# Patient Record
Sex: Female | Born: 1975 | Marital: Single | Smoking: Former smoker
Health system: Southern US, Community
[De-identification: ages and names within clinical notes are randomized; demographics above are authoritative.]

## PROBLEM LIST (undated history)

## (undated) DIAGNOSIS — M5430 Sciatica, unspecified side: Secondary | ICD-10-CM

## (undated) DIAGNOSIS — L732 Hidradenitis suppurativa: Secondary | ICD-10-CM

## (undated) DIAGNOSIS — E669 Obesity, unspecified: Secondary | ICD-10-CM

---

## 2017-02-02 DIAGNOSIS — J45909 Unspecified asthma, uncomplicated: Secondary | ICD-10-CM | POA: Insufficient documentation

## 2017-09-18 ENCOUNTER — Ambulatory Visit: Payer: Managed Care, Other (non HMO) | Admitting: Sports Medicine

## 2017-10-02 ENCOUNTER — Ambulatory Visit (INDEPENDENT_AMBULATORY_CARE_PROVIDER_SITE_OTHER): Payer: Managed Care, Other (non HMO) | Admitting: Sports Medicine

## 2017-10-02 ENCOUNTER — Encounter: Payer: Self-pay | Admitting: Sports Medicine

## 2017-10-02 ENCOUNTER — Ambulatory Visit (INDEPENDENT_AMBULATORY_CARE_PROVIDER_SITE_OTHER): Payer: Managed Care, Other (non HMO)

## 2017-10-02 DIAGNOSIS — K602 Anal fissure, unspecified: Secondary | ICD-10-CM | POA: Insufficient documentation

## 2017-10-02 DIAGNOSIS — M2011 Hallux valgus (acquired), right foot: Secondary | ICD-10-CM | POA: Diagnosis not present

## 2017-10-02 DIAGNOSIS — M79671 Pain in right foot: Secondary | ICD-10-CM | POA: Diagnosis not present

## 2017-10-02 DIAGNOSIS — L732 Hidradenitis suppurativa: Secondary | ICD-10-CM | POA: Insufficient documentation

## 2017-10-02 DIAGNOSIS — B009 Herpesviral infection, unspecified: Secondary | ICD-10-CM | POA: Insufficient documentation

## 2017-10-02 NOTE — Progress Notes (Signed)
Subjective: Whitney RadMarisa Baker is a 42 y.o. female patient who presents to office for evaluation of Right> Left bunion pain. Patient complains of progressive pain especially over the last 2 months in the Right>Left foot that starts as pain over the bump with direct pressure and range of motion; patient now has difficulty fitting shoes comfortably. Ranks pain 5/10 and is now interferring with daily activities but now swelling over bunion appears to be going down.  Patient has also tried change in shoes; tennis shoes help. Patient denies any other pedal complaints.  Review of Systems  Musculoskeletal: Positive for joint pain.  All other systems reviewed and are negative.   +Family history of bunions   Patient Active Problem List   Diagnosis Date Noted  . Anal fissure 10/02/2017  . 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 Hidradenitis suppurativa 10/02/2017  . Asthma 02/02/2017    Current Outpatient Medications on File Prior to Visit  Medication Sig Dispense Refill  . clindamycin (CLEOCIN) 300 MG capsule clindamycin HCl 300 mg capsule  TAKE 1 CAPSULE BY MOUTH TWICE A DAY    . Norethindrone-Ethinyl Estradiol-Fe Biphas (LO LOESTRIN FE) 1 MG-10 MCG / 10 MCG tablet Lo Loestrin Fe 1 mg-10 mcg (24)/10 mcg (2) tablet    . rifampin (RIFADIN) 300 MG capsule Take 300 mg by mouth 2 (two) times daily.  2   No current facility-administered medications on file prior to visit.     No Known Allergies  Objective:  General: Alert and oriented x3 in no acute distress  Dermatology: No open lesions bilateral lower extremities, no webspace macerations, no ecchymosis bilateral, all nails x 10 are well manicured.  Vascular: Dorsalis Pedis and Posterior Tibial pedal pulses 2/4, Capillary Fill Time 3 seconds, (+) pedal hair growth bilateral, no edema bilateral lower extremities, Temperature gradient within normal limits.  Neurology: Gross sensation intact via light touch bilateral.  Musculoskeletal:  Mild tenderness with palpation right>left bunion deformity, no limitation or crepitus with range of motion, deformity reducible, tracking not trackbound, there is no 1st ray hypermobility noted bilateral. Midtarsal, Subtalar joint, and ankle joint range of motion is within normal limits. On weightbearing exam, there is decreased 1st MTPJ rom Right>Left with functional limitus noted, there is medial arch collapse Right> Left on weightbearing, rearfoot slight varus/valgus, forefoot slight abduction with HAV deformity supported on ground with no second toe crossover deformity noted.   Xrays  Right Foot    Impression: Intermetatarsal angle above normal limits supportive of bunion      Assessment and Plan: Problem List Items Addressed This Visit    None    Visit Diagnoses    Hallux valgus of right foot    -  Primary   Relevant Orders   DG Foot Complete Right (Completed)   Right foot pain           -Complete examination performed -Xrays reviewed -Discussed treatement options; discussed HAV deformity;conservative and  Surgical management; risks, benefits, alternatives discussed. All patient's questions answered. -Patient declines surgery and wants conservative care -Recommend continue with good supportive shoes and inserts. Note given for work. -Patient to return to office as needed or sooner if condition worsens.  Whitney Baker, DPM

## 2017-10-02 NOTE — Patient Instructions (Signed)

## 2019-07-07 DIAGNOSIS — Z01419 Encounter for gynecological examination (general) (routine) without abnormal findings: Secondary | ICD-10-CM | POA: Diagnosis not present

## 2019-07-07 DIAGNOSIS — Z124 Encounter for screening for malignant neoplasm of cervix: Secondary | ICD-10-CM | POA: Diagnosis not present

## 2019-07-07 DIAGNOSIS — Z1239 Encounter for other screening for malignant neoplasm of breast: Secondary | ICD-10-CM | POA: Diagnosis not present

## 2019-07-07 DIAGNOSIS — Z6841 Body Mass Index (BMI) 40.0 and over, adult: Secondary | ICD-10-CM | POA: Diagnosis not present

## 2019-07-07 DIAGNOSIS — Z309 Encounter for contraceptive management, unspecified: Secondary | ICD-10-CM | POA: Diagnosis not present

## 2019-07-07 DIAGNOSIS — R87810 Cervical high risk human papillomavirus (HPV) DNA test positive: Secondary | ICD-10-CM | POA: Diagnosis not present

## 2019-07-07 DIAGNOSIS — Z1231 Encounter for screening mammogram for malignant neoplasm of breast: Secondary | ICD-10-CM | POA: Diagnosis not present

## 2019-07-07 LAB — HM MAMMOGRAPHY

## 2019-07-07 LAB — RESULTS CONSOLE HPV: CHL HPV: POSITIVE

## 2019-07-07 LAB — HM PAP SMEAR: HM Pap smear: NORMAL

## 2019-07-15 DIAGNOSIS — B977 Papillomavirus as the cause of diseases classified elsewhere: Secondary | ICD-10-CM | POA: Insufficient documentation

## 2020-05-11 DIAGNOSIS — B373 Candidiasis of vulva and vagina: Secondary | ICD-10-CM | POA: Diagnosis not present

## 2020-05-11 DIAGNOSIS — N760 Acute vaginitis: Secondary | ICD-10-CM | POA: Diagnosis not present

## 2020-05-11 DIAGNOSIS — N898 Other specified noninflammatory disorders of vagina: Secondary | ICD-10-CM | POA: Diagnosis not present

## 2020-05-24 ENCOUNTER — Other Ambulatory Visit: Payer: Self-pay

## 2020-05-24 ENCOUNTER — Encounter (HOSPITAL_COMMUNITY): Payer: Self-pay

## 2020-05-24 ENCOUNTER — Ambulatory Visit (HOSPITAL_COMMUNITY)
Admission: EM | Admit: 2020-05-24 | Discharge: 2020-05-24 | Disposition: A | Discharge status: Home / Self Care | Payer: BC Managed Care – PPO | Attending: Family Medicine | Admitting: Family Medicine

## 2020-05-24 DIAGNOSIS — M79605 Pain in left leg: Secondary | ICD-10-CM

## 2020-05-24 DIAGNOSIS — M5432 Sciatica, left side: Secondary | ICD-10-CM | POA: Diagnosis not present

## 2020-05-24 MED ORDER — KETOROLAC TROMETHAMINE 60 MG/2ML IM SOLN
60.0000 mg | Freq: Once | INTRAMUSCULAR | Status: AC
  Administered 2020-05-24: 60 mg via INTRAMUSCULAR

## 2020-05-24 MED ORDER — KETOROLAC TROMETHAMINE 60 MG/2ML IM SOLN
INTRAMUSCULAR | Status: AC
  Filled 2020-05-24: qty 2

## 2020-05-24 MED ORDER — CYCLOBENZAPRINE HCL 10 MG PO TABS: 10.0000 mg | ORAL_TABLET | Freq: Three times a day (TID) | ORAL | 0 refills | Status: DC | PRN

## 2020-05-24 MED ORDER — PREDNISONE 20 MG PO TABS: 40.0000 mg | ORAL_TABLET | Freq: Every day | ORAL | 0 refills | Status: DC

## 2020-05-24 NOTE — ED Triage Notes (Signed)
Pt c/o left posterior thigh and buttocks pain intermittently for past several months, worse the past couple days. Pt states she took someone's muscle relaxants last night with some improvement to pain.   Pt took 800mg  ibuprofen and 1000mg  tylenol yesterday. Has been applying ice/heat.  Denies injury to area.

## 2020-05-24 NOTE — ED Provider Notes (Addendum)
[facility]    CSN: [id number] Arrival date & time: 05/24/20  2423      History   Chief Complaint Chief Complaint  Patient presents with  . Leg Pain    HPI Whitney Baker is a 45 y.o. female.   Patient presenting today with acute on chronic left buttock and posterior leg pain with radiation down toward foot, numbness, tingling that she states has been ongoing for the past few months but worse the past couple days to where there is no comfortable position that she can find.  She denies any injury at onset of symptoms.  No bowel or bladder incontinence, saddle paresthesias, fever, chills, gait instability, known chronic back issues.  Has been taking over-the-counter pain relievers with minimal relief and a friend gave her some baclofen which she has been taking the past few days with mild relief.  Heat also does help some temporarily.     History reviewed. No pertinent past medical history.  Patient Active Problem List   Diagnosis Date Noted  . Anal fissure 10/02/2017  . 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 Hidradenitis suppurativa 10/02/2017  . Asthma 02/02/2017    History reviewed. No pertinent surgical history.  OB History   No obstetric history on file.      Home Medications    Prior to Admission medications   Medication Sig Start Date End Date Taking? Authorizing Provider  cyclobenzaprine (FLEXERIL) 10 MG tablet Take 1 tablet (10 mg total) by mouth 3 (three) times daily as needed for muscle spasms. DO NOT DRINK ALCOHOL OR DRIVE WHILE TAKING THIS MEDICATION 05/24/20  Yes Particia Nearing, PA-C  Norethindrone-Ethinyl Estradiol-Fe Biphas (LO LOESTRIN FE) 1 MG-10 MCG / 10 MCG tablet Lo Loestrin Fe 1 mg-10 mcg (24)/10 mcg (2) tablet   Yes [provider]  predniSONE (DELTASONE) 20 MG tablet Take 2 tablets (40 mg total) by mouth daily with breakfast. 05/24/20  Yes Particia Nearing, PA-C  clindamycin (CLEOCIN) 300 MG capsule  clindamycin HCl 300 mg capsule  TAKE 1 CAPSULE BY MOUTH TWICE A DAY    [provider]  rifampin (RIFADIN) 300 MG capsule Take 300 mg by mouth 2 (two) times daily. 09/10/17   [provider]    Family History History reviewed. No pertinent family history.  Social History Social History   Tobacco Use  . Smoking status: Former Games developer  . Smokeless tobacco: Never Used  Substance Use Topics  . Alcohol use: Yes    Comment: occas  . Drug use: Never     Allergies   Patient has no known allergies.   Review of Systems Review of Systems Per HPI  Physical Exam Triage Vital Signs ED Triage Vitals  Enc Vitals Group     BP 05/24/20 0930 132/90     Pulse Rate 05/24/20 0930 (!) 112     Resp 05/24/20 0930 20     Temp 05/24/20 0930 98.3 F (36.8 C)     Temp Source 05/24/20 0930 Oral     SpO2 05/24/20 0930 98 %     Weight --      Height --      Head Circumference --      Peak Flow --      Pain Score 05/24/20 0928 10     Pain Loc --      Pain Edu? --      Excl. in GC? --    No data found.  Updated Vital  Signs BP 132/90 (BP Location: Right Arm)   Pulse (!) 112   Temp 98.3 F (36.8 C) (Oral)   Resp 20   LMP 01/27/2020 (Approximate) Comment: oral contraceptive  SpO2 98%   Visual Acuity Right Eye Distance:   Left Eye Distance:   Bilateral Distance:    Right Eye Near:   Left Eye Near:    Bilateral Near:     Physical Exam Vitals and nursing note reviewed.  Constitutional:      Appearance: Normal appearance. She is not ill-appearing.  HENT:     Head: Atraumatic.  Eyes:     Extraocular Movements: Extraocular movements intact.     Conjunctiva/sclera: Conjunctivae normal.  Cardiovascular:     Rate and Rhythm: Normal rate and regular rhythm.     Pulses: Normal pulses.     Heart sounds: Normal heart sounds.  Pulmonary:     Effort: Pulmonary effort is normal.     Breath sounds: Normal breath sounds.  Abdominal:     General: Bowel sounds are normal.  There is no distension.     Palpations: Abdomen is soft.     Tenderness: There is no abdominal tenderness. There is no guarding.  Musculoskeletal:        General: Tenderness present. Normal range of motion.     Cervical back: Normal range of motion and neck supple.     Comments: Posterior left buttock extending down left posterior upper leg tender to palpation Negative straight leg raise bilateral lower extremities, though testing limited by patient's significant pain with any range of motion  Skin:    General: Skin is warm and dry.  Neurological:     Mental Status: She is alert and oriented to person, place, and time.     Sensory: No sensory deficit.     Motor: No weakness.     Gait: Gait abnormal (antalgic gait).  Psychiatric:        Mood and Affect: Mood normal.        Thought Content: Thought content normal.        Judgment: Judgment normal.      UC Treatments / Results  Labs (all labs ordered are listed, but only abnormal results are displayed) Labs Reviewed - No data to display  EKG   Radiology No results found.  Procedures Procedures (including critical care time)  Medications Ordered in UC Medications  ketorolac (TORADOL) injection 60 mg (60 mg Intramuscular Given 05/24/20 1024)    Initial Impression / Assessment and Plan / UC Course  I have reviewed the triage vital signs and the nursing notes.  Pertinent labs & imaging results that were available during my care of the patient were reviewed by me and considered in my medical decision making (see chart for details).     Suspect muscular strain causing compression of sciatic nerve.  Will treat with IM Toradol given in clinic today, prednisone burst, Flexeril as needed, stretches, heat, rest, over-the-counter pain relievers as needed.  Work note given, sports medicine follow-up if not resolving.  ED if acutely worsening symptoms.  Final Clinical Impressions(s) / UC Diagnoses   Final diagnoses:  Left leg pain   Sciatica of left side   Discharge Instructions   None    ED Prescriptions    Medication Sig Dispense Auth. Provider   predniSONE (DELTASONE) 20 MG tablet Take 2 tablets (40 mg total) by mouth daily with breakfast. 10 tablet Particia Nearing, PA-C   cyclobenzaprine (FLEXERIL) 10 MG tablet Take  1 tablet (10 mg total) by mouth 3 (three) times daily as needed for muscle spasms. DO NOT DRINK ALCOHOL OR DRIVE WHILE TAKING THIS MEDICATION 15 tablet Particia Nearing, [place]     PDMP not reviewed this encounter.   Particia Nearing, PA-C 05/24/20 1720    Particia Nearing, [place] 05/24/20 1721

## 2020-06-09 ENCOUNTER — Emergency Department (HOSPITAL_COMMUNITY): Payer: BC Managed Care – PPO

## 2020-06-09 ENCOUNTER — Encounter (HOSPITAL_COMMUNITY): Payer: Self-pay

## 2020-06-09 ENCOUNTER — Other Ambulatory Visit: Payer: Self-pay

## 2020-06-09 ENCOUNTER — Inpatient Hospital Stay (HOSPITAL_COMMUNITY)
Admission: EM | Admit: 2020-06-09 | Discharge: 2020-06-12 | DRG: 637 | Disposition: A | Discharge status: Home / Self Care | Payer: BC Managed Care – PPO | Attending: Student | Admitting: Student

## 2020-06-09 DIAGNOSIS — R000 Tachycardia, unspecified: Secondary | ICD-10-CM | POA: Diagnosis not present

## 2020-06-09 DIAGNOSIS — E1100 Type 2 diabetes mellitus with hyperosmolarity without nonketotic hyperglycemic-hyperosmolar coma (NKHHC): Secondary | ICD-10-CM | POA: Diagnosis not present

## 2020-06-09 DIAGNOSIS — G9341 Metabolic encephalopathy: Secondary | ICD-10-CM | POA: Diagnosis present

## 2020-06-09 DIAGNOSIS — D72829 Elevated white blood cell count, unspecified: Secondary | ICD-10-CM | POA: Diagnosis not present

## 2020-06-09 DIAGNOSIS — E860 Dehydration: Secondary | ICD-10-CM | POA: Diagnosis present

## 2020-06-09 DIAGNOSIS — Z6838 Body mass index (BMI) 38.0-38.9, adult: Secondary | ICD-10-CM | POA: Diagnosis not present

## 2020-06-09 DIAGNOSIS — G5702 Lesion of sciatic nerve, left lower limb: Secondary | ICD-10-CM | POA: Diagnosis not present

## 2020-06-09 DIAGNOSIS — Z87891 Personal history of nicotine dependence: Secondary | ICD-10-CM

## 2020-06-09 DIAGNOSIS — E1110 Type 2 diabetes mellitus with ketoacidosis without coma: Principal | ICD-10-CM

## 2020-06-09 DIAGNOSIS — Z833 Family history of diabetes mellitus: Secondary | ICD-10-CM

## 2020-06-09 DIAGNOSIS — N179 Acute kidney failure, unspecified: Secondary | ICD-10-CM | POA: Diagnosis not present

## 2020-06-09 DIAGNOSIS — R739 Hyperglycemia, unspecified: Secondary | ICD-10-CM | POA: Diagnosis not present

## 2020-06-09 DIAGNOSIS — E1310 Other specified diabetes mellitus with ketoacidosis without coma: Secondary | ICD-10-CM

## 2020-06-09 DIAGNOSIS — D751 Secondary polycythemia: Secondary | ICD-10-CM | POA: Diagnosis not present

## 2020-06-09 DIAGNOSIS — E1165 Type 2 diabetes mellitus with hyperglycemia: Secondary | ICD-10-CM | POA: Diagnosis not present

## 2020-06-09 DIAGNOSIS — I10 Essential (primary) hypertension: Secondary | ICD-10-CM | POA: Diagnosis not present

## 2020-06-09 DIAGNOSIS — M5442 Lumbago with sciatica, left side: Secondary | ICD-10-CM | POA: Diagnosis present

## 2020-06-09 DIAGNOSIS — E669 Obesity, unspecified: Secondary | ICD-10-CM | POA: Diagnosis not present

## 2020-06-09 DIAGNOSIS — Z79899 Other long term (current) drug therapy: Secondary | ICD-10-CM

## 2020-06-09 DIAGNOSIS — Z7952 Long term (current) use of systemic steroids: Secondary | ICD-10-CM | POA: Diagnosis not present

## 2020-06-09 DIAGNOSIS — R110 Nausea: Secondary | ICD-10-CM | POA: Diagnosis not present

## 2020-06-09 DIAGNOSIS — E1301 Other specified diabetes mellitus with hyperosmolarity with coma: Secondary | ICD-10-CM | POA: Diagnosis not present

## 2020-06-09 DIAGNOSIS — E1111 Type 2 diabetes mellitus with ketoacidosis with coma: Secondary | ICD-10-CM | POA: Diagnosis not present

## 2020-06-09 DIAGNOSIS — Z20822 Contact with and (suspected) exposure to covid-19: Secondary | ICD-10-CM | POA: Diagnosis not present

## 2020-06-09 DIAGNOSIS — T380X5A Adverse effect of glucocorticoids and synthetic analogues, initial encounter: Secondary | ICD-10-CM | POA: Diagnosis present

## 2020-06-09 DIAGNOSIS — D72828 Other elevated white blood cell count: Secondary | ICD-10-CM | POA: Diagnosis not present

## 2020-06-09 DIAGNOSIS — E876 Hypokalemia: Secondary | ICD-10-CM | POA: Diagnosis not present

## 2020-06-09 DIAGNOSIS — R0600 Dyspnea, unspecified: Secondary | ICD-10-CM | POA: Diagnosis not present

## 2020-06-09 DIAGNOSIS — R42 Dizziness and giddiness: Secondary | ICD-10-CM | POA: Diagnosis not present

## 2020-06-09 DIAGNOSIS — D72825 Bandemia: Secondary | ICD-10-CM | POA: Diagnosis not present

## 2020-06-09 HISTORY — DX: Obesity, unspecified: E66.9

## 2020-06-09 HISTORY — DX: Sciatica, unspecified side: M54.30

## 2020-06-09 HISTORY — DX: Type 2 diabetes mellitus with ketoacidosis without coma: E11.10

## 2020-06-09 HISTORY — DX: Hidradenitis suppurativa: L73.2

## 2020-06-09 LAB — I-STAT CHEM 8, ED
BUN: 41 mg/dL — ABNORMAL HIGH (ref 6–20)
Calcium, Ion: 1.26 mmol/L (ref 1.15–1.40)
Chloride: 103 mmol/L (ref 98–111)
Creatinine, Ser: 1.6 mg/dL — ABNORMAL HIGH (ref 0.44–1.00)
Glucose, Bld: >700 mg/dL (ref 70–99)
HCT: 55 % — ABNORMAL HIGH (ref 36.0–46.0)
Hemoglobin: 18.7 g/dL — ABNORMAL HIGH (ref 12.0–15.0)
Potassium: 4.9 mmol/L (ref 3.5–5.1)
Sodium: 138 mmol/L (ref 135–145)
TCO2: 15 mmol/L — ABNORMAL LOW (ref 22–32)

## 2020-06-09 LAB — CBG MONITORING, ED
Glucose-Capillary: >600 mg/dL (ref 70–99)
Glucose-Capillary: >600 mg/dL (ref 70–99)
Glucose-Capillary: >600 mg/dL (ref 70–99)
Glucose-Capillary: >600 mg/dL (ref 70–99)
Glucose-Capillary: 555 mg/dL (ref 70–99)
Glucose-Capillary: >600 mg/dL (ref 70–99)
Glucose-Capillary: 444 mg/dL — ABNORMAL HIGH (ref 70–99)
Glucose-Capillary: 495 mg/dL — ABNORMAL HIGH (ref 70–99)
Glucose-Capillary: 439 mg/dL — ABNORMAL HIGH (ref 70–99)
Glucose-Capillary: 374 mg/dL — ABNORMAL HIGH (ref 70–99)

## 2020-06-09 LAB — RAPID URINE DRUG SCREEN, HOSP PERFORMED
Amphetamines: NOT DETECTED
Barbiturates: NOT DETECTED
Benzodiazepines: NOT DETECTED
Cocaine: NOT DETECTED
Opiates: NOT DETECTED
Tetrahydrocannabinol: POSITIVE — AB

## 2020-06-09 LAB — URINALYSIS, ROUTINE W REFLEX MICROSCOPIC
Bilirubin Urine: NEGATIVE
Glucose, UA: >=500 mg/dL — AB
Ketones, ur: 80 mg/dL — AB
Leukocytes,Ua: NEGATIVE
Nitrite: NEGATIVE
Protein, ur: NEGATIVE mg/dL
Specific Gravity, Urine: 1.03 (ref 1.005–1.030)
pH: 5 (ref 5.0–8.0)

## 2020-06-09 LAB — BASIC METABOLIC PANEL
Anion gap: 32 — ABNORMAL HIGH (ref 5–15)
Anion gap: 18 — ABNORMAL HIGH (ref 5–15)
Anion gap: 23 — ABNORMAL HIGH (ref 5–15)
BUN: 37 mg/dL — ABNORMAL HIGH (ref 6–20)
BUN: 23 mg/dL — ABNORMAL HIGH (ref 6–20)
BUN: 34 mg/dL — ABNORMAL HIGH (ref 6–20)
CO2: 11 mmol/L — ABNORMAL LOW (ref 22–32)
CO2: 17 mmol/L — ABNORMAL LOW (ref 22–32)
CO2: 19 mmol/L — ABNORMAL LOW (ref 22–32)
Calcium: 10.4 mg/dL — ABNORMAL HIGH (ref 8.9–10.3)
Calcium: 9.7 mg/dL (ref 8.9–10.3)
Calcium: 9.8 mg/dL (ref 8.9–10.3)
Chloride: 93 mmol/L — ABNORMAL LOW (ref 98–111)
Chloride: 107 mmol/L (ref 98–111)
Chloride: 114 mmol/L — ABNORMAL HIGH (ref 98–111)
Creatinine, Ser: 2.37 mg/dL — ABNORMAL HIGH (ref 0.44–1.00)
Creatinine, Ser: 1.65 mg/dL — ABNORMAL HIGH (ref 0.44–1.00)
Creatinine, Ser: 2.17 mg/dL — ABNORMAL HIGH (ref 0.44–1.00)
GFR, Estimated: 25 mL/min — ABNORMAL LOW (ref >60)
GFR, Estimated: 28 mL/min — ABNORMAL LOW (ref >60)
GFR, Estimated: 39 mL/min — ABNORMAL LOW (ref >60)
Glucose, Bld: 1434 mg/dL (ref 70–99)
Glucose, Bld: 480 mg/dL — ABNORMAL HIGH (ref 70–99)
Glucose, Bld: 731 mg/dL (ref 70–99)
Potassium: 4.8 mmol/L (ref 3.5–5.1)
Potassium: 3.8 mmol/L (ref 3.5–5.1)
Potassium: 4 mmol/L (ref 3.5–5.1)
Sodium: 136 mmol/L (ref 135–145)
Sodium: 147 mmol/L — ABNORMAL HIGH (ref 135–145)
Sodium: 151 mmol/L — ABNORMAL HIGH (ref 135–145)

## 2020-06-09 LAB — I-STAT VENOUS BLOOD GAS, ED
Acid-base deficit: 11 mmol/L — ABNORMAL HIGH (ref 0.0–2.0)
Bicarbonate: 13.2 mmol/L — ABNORMAL LOW (ref 20.0–28.0)
Calcium, Ion: 1.22 mmol/L (ref 1.15–1.40)
HCT: 52 % — ABNORMAL HIGH (ref 36.0–46.0)
Hemoglobin: 17.7 g/dL — ABNORMAL HIGH (ref 12.0–15.0)
O2 Saturation: 85 %
Potassium: 4.7 mmol/L (ref 3.5–5.1)
Sodium: 137 mmol/L (ref 135–145)
TCO2: 14 mmol/L — ABNORMAL LOW (ref 22–32)
pCO2, Ven: 27 mmHg — ABNORMAL LOW (ref 44.0–60.0)
pH, Ven: 7.295 (ref 7.250–7.430)
pO2, Ven: 54 mmHg — ABNORMAL HIGH (ref 32.0–45.0)

## 2020-06-09 LAB — CBC
HCT: 55.4 % — ABNORMAL HIGH (ref 36.0–46.0)
Hemoglobin: 17.2 g/dL — ABNORMAL HIGH (ref 12.0–15.0)
MCH: 27.6 pg (ref 26.0–34.0)
MCHC: 31 g/dL (ref 30.0–36.0)
MCV: 88.8 fL (ref 80.0–100.0)
Platelets: 321 10*3/uL (ref 150–400)
RBC: 6.24 MIL/uL — ABNORMAL HIGH (ref 3.87–5.11)
RDW: 13.4 % (ref 11.5–15.5)
WBC: 15.9 10*3/uL — ABNORMAL HIGH (ref 4.0–10.5)
nRBC: 0 % (ref 0.0–0.2)

## 2020-06-09 LAB — HIV ANTIBODY (ROUTINE TESTING W REFLEX): HIV Screen 4th Generation wRfx: NONREACTIVE

## 2020-06-09 LAB — RESP PANEL BY RT-PCR (FLU A&B, COVID) ARPGX2
Influenza A by PCR: NEGATIVE
Influenza B by PCR: NEGATIVE
SARS Coronavirus 2 by RT PCR: NEGATIVE

## 2020-06-09 LAB — GLUCOSE, CAPILLARY: Glucose-Capillary: 351 mg/dL — ABNORMAL HIGH (ref 70–99)

## 2020-06-09 LAB — BETA-HYDROXYBUTYRIC ACID
Beta-Hydroxybutyric Acid: >8 mmol/L — ABNORMAL HIGH (ref 0.05–0.27)
Beta-Hydroxybutyric Acid: >8 mmol/L — ABNORMAL HIGH (ref 0.05–0.27)

## 2020-06-09 LAB — I-STAT BETA HCG BLOOD, ED (MC, WL, AP ONLY): I-stat hCG, quantitative: <5 m[IU]/mL (ref <5)

## 2020-06-09 MED ORDER — LACTATED RINGERS IV BOLUS
2500.0000 mL | Freq: Once | INTRAVENOUS | Status: AC
  Administered 2020-06-09: 2500 mL via INTRAVENOUS

## 2020-06-09 MED ORDER — DEXTROSE IN LACTATED RINGERS 5 % IV SOLN: INTRAVENOUS | Status: DC

## 2020-06-09 MED ORDER — LACTATED RINGERS IV SOLN: INTRAVENOUS | Status: DC

## 2020-06-09 MED ORDER — SODIUM CHLORIDE 0.9 % IV BOLUS
1000.0000 mL | Freq: Once | INTRAVENOUS | Status: AC
  Administered 2020-06-09: 1000 mL via INTRAVENOUS

## 2020-06-09 MED ORDER — INSULIN REGULAR(HUMAN) IN NACL 100-0.9 UT/100ML-% IV SOLN
INTRAVENOUS | Status: DC
  Administered 2020-06-09: 5 [IU]/h via INTRAVENOUS
  Administered 2020-06-10: 9 [IU]/h via INTRAVENOUS
  Filled 2020-06-09 (×2): qty 100

## 2020-06-09 MED ORDER — SODIUM CHLORIDE 0.45 % IV SOLN: INTRAVENOUS | Status: DC

## 2020-06-09 MED ORDER — DEXTROSE 50 % IV SOLN: 0.0000 mL | INTRAVENOUS | Status: DC | PRN

## 2020-06-09 MED ORDER — ENOXAPARIN SODIUM 40 MG/0.4ML ~~LOC~~ SOLN
40.0000 mg | SUBCUTANEOUS | Status: DC
  Administered 2020-06-09 – 2020-06-11 (×3): 40 mg via SUBCUTANEOUS
  Filled 2020-06-09 (×2): qty 0.4

## 2020-06-09 MED ORDER — POTASSIUM CHLORIDE 10 MEQ/100ML IV SOLN
10.0000 meq | INTRAVENOUS | Status: AC
  Administered 2020-06-09 (×2): 10 meq via INTRAVENOUS
  Filled 2020-06-09 (×2): qty 100

## 2020-06-09 NOTE — ED Notes (Signed)
Will draw blood once bonus completes, bolus is still infusing (was stopped due to [facility] IV (was not running to gravity)

## 2020-06-09 NOTE — H&P (Addendum)
History and Physical    Whitney Baker VZD:[id number] DOB: [birth year] DOA: 06/09/2020  Referring MD/NP/PA: Claudette Stapler, PA-C PCP: Patient, No Pcp Per  Patient coming from: Work at [employer] via EMS  Chief Complaint: Dizziness  I have personally briefly reviewed patient's old medical records in [facility]   HPI: Whitney Baker is a 45 y.o. female with medical history significant of sciatica, hidradenitis, and obesity presents with complaints of dizziness while at work.  History is limited due to the patient's altered mental status.  She initially states that the reason she is here is for her left-sided sciatic nerve pain and that she took a Flexeril.  Records note patient was seen in the ED for left leg pain diagnosed with sciatica of the left side on 3/7.  At that time she was given prescriptions for prednisone and Flexeril.  Since being seen in the emergency department she has had increased thirst, urinary frequency, blurred vision, and weight loss.  However, she reports going from 295 pounds down to 230 pounds. Patient 's Mother confirms that patient had lost this weight in the last 1 month. Denies any fever, chills, chest pain, abdominal pain, nausea, vomiting, or dysuria.  She reports that no one in her family has diabetes, but her mother states that her grandmother had diabetes. She goes to the OB/GYN, but does not have a primary care provider.  She also admits to possibly taking a CBD pill at some point time.  ED Course: Upon admission to the emergency department patient was seen to be afebrile, pulse 10 7-111, respirations 16-24, blood pressure 115/97-130 3/110, and O2 saturation maintained on room air. CT scan of the brain without contrast was negative.  Labs significant for WBC 15.9, hemoglobin 17.2 sodium 136, chloride 93, CO2 11, BUN 37, creatinine 2.37, glucose 1434, calcium 10.4, anion gap 32, and beta hydroxybutyrate greater than 8.  Urinalysis positive for glucose  and ketones.  Patient was given bolus of 3.5 L of IV fluids, 20 mEq of potassium, and started on insulin drip.  Review of Systems  Unable to perform ROS: Mental status change  Constitutional: Positive for malaise/fatigue and weight loss. Negative for fever.  HENT: Negative for congestion and ear pain.   Eyes: Positive for blurred vision. Negative for discharge.  Respiratory: Negative for cough and shortness of breath.   Cardiovascular: Negative for chest pain and claudication.  Gastrointestinal: Negative for abdominal pain, diarrhea, nausea and vomiting.  Genitourinary: Positive for frequency. Negative for dysuria and hematuria.  Musculoskeletal: Positive for back pain. Negative for falls.  Skin: Negative for itching.  Neurological: Positive for dizziness. Negative for loss of consciousness.  Endo/Heme/Allergies: Positive for polydipsia.  Psychiatric/Behavioral: Negative for memory loss and substance abuse.    Past Medical History:  Diagnosis Date  . Obesity   . Sciatic leg pain     History reviewed. No pertinent surgical history.   reports that she has quit smoking. She has never used smokeless tobacco. She reports current alcohol use. She reports that she does not use drugs.  No Known Allergies  Family History  Problem Relation Age of Onset  . Diabetes Neg Hx     Prior to Admission medications   Medication Sig Start Date End Date Taking? Authorizing Provider  clindamycin (CLEOCIN) 300 MG capsule clindamycin HCl 300 mg capsule  TAKE 1 CAPSULE BY MOUTH TWICE A DAY    [provider]  cyclobenzaprine (FLEXERIL) 10 MG tablet Take 1 tablet (10 mg total)  by mouth 3 (three) times daily as needed for muscle spasms. DO NOT DRINK ALCOHOL OR DRIVE WHILE TAKING THIS MEDICATION 05/24/20   Particia Nearing, PA-C  Norethindrone-Ethinyl Estradiol-Fe Biphas (LO LOESTRIN FE) 1 MG-10 MCG / 10 MCG tablet Lo Loestrin Fe 1 mg-10 mcg (24)/10 mcg (2) tablet    [provider]   predniSONE (DELTASONE) 20 MG tablet Take 2 tablets (40 mg total) by mouth daily with breakfast. 05/24/20   Particia Nearing, PA-C  rifampin (RIFADIN) 300 MG capsule Take 300 mg by mouth 2 (two) times daily. 09/10/17   [provider]    Physical Exam:  Constitutional: Middle-aged female who appears to be altered, but able to follow command Vitals:   06/09/20 1233 06/09/20 1315 06/09/20 1345 06/09/20 1433  BP: (!) 120/102 (!) 115/97 (!) 133/110   Pulse: (!) 111  (!) 107   Resp: 16  (!) 24   Temp: 98 F (36.7 C)     SpO2: 96%  96%   Weight:    104.8 kg  Height:    5\' 8"  (1.727 m)   Eyes: PERRL, lids and conjunctivae normal ENMT: Mucous membranes are dry. Posterior pharynx clear of any exudate or lesions.  Neck: normal, supple, no masses, no thyromegaly Respiratory: clear to auscultation bilaterally, no wheezing, no crackles. Normal respiratory effort. No accessory muscle use.  Cardiovascular: Tachycardic, no murmurs / rubs / gallops. No extremity edema. 2+ pedal pulses. No carotid bruits.  Abdomen: no tenderness, no masses palpated. No hepatosplenomegaly. Bowel sounds positive.  Musculoskeletal: no clubbing / cyanosis. No joint deformity upper and lower extremities. Good ROM, no contractures. Normal muscle tone.  Skin: no rashes, lesions, ulcers. No induration Neurologic: CN 2-12 grossly intact. Sensation intact, DTR normal. Strength 5/5 in all 4.  Psychiatric: Confused, but oriented to person, place, and time.     Labs on Admission: I have personally reviewed following labs and imaging studies  CBC: Recent Labs  Lab 06/09/20 1240 06/09/20 1325 06/09/20 1330  WBC 15.9*  --   --   HGB 17.2* 18.7* 17.7*  HCT 55.4* 55.0* 52.0*  MCV 88.8  --   --   PLT 321  --   --    Basic Metabolic Panel: Recent Labs  Lab 06/09/20 1240 06/09/20 1325 06/09/20 1330  NA 136 138 137  K 4.8 4.9 4.7  CL 93* 103  --   CO2 11*  --   --   GLUCOSE 1,434* >700*  --   BUN 37* 41*   --   CREATININE 2.37* 1.60*  --   CALCIUM 10.4*  --   --    GFR: Estimated Creatinine Clearance: 57.5 mL/min (A) (by C-G formula based on SCr of 1.6 mg/dL (H)). Liver Function Tests: No results for input(s): AST, ALT, ALKPHOS, BILITOT, PROT, ALBUMIN in the last 168 hours. No results for input(s): LIPASE, AMYLASE in the last 168 hours. No results for input(s): AMMONIA in the last 168 hours. Coagulation Profile: No results for input(s): INR, PROTIME in the last 168 hours. Cardiac Enzymes: No results for input(s): CKTOTAL, CKMB, CKMBINDEX, TROPONINI in the last 168 hours. BNP (last 3 results) No results for input(s): PROBNP in the last 8760 hours. HbA1C: No results for input(s): HGBA1C in the last 72 hours. CBG: Recent Labs  Lab 06/09/20 1235 06/09/20 1339  GLUCAP >600* >600*   Lipid Profile: No results for input(s): CHOL, HDL, LDLCALC, TRIG, CHOLHDL, LDLDIRECT in the last 72 hours. Thyroid Function Tests: No  results for input(s): TSH, T4TOTAL, FREET4, T3FREE, THYROIDAB in the last 72 hours. Anemia Panel: No results for input(s): VITAMINB12, FOLATE, FERRITIN, TIBC, IRON, RETICCTPCT in the last 72 hours. Urine analysis:    Component Value Date/Time   COLORURINE STRAW (A) 06/09/2020 1240   APPEARANCEUR CLEAR 06/09/2020 1240   LABSPEC 1.030 06/09/2020 1240   PHURINE 5.0 06/09/2020 1240   GLUCOSEU >=500 (A) 06/09/2020 1240   HGBUR SMALL (A) 06/09/2020 1240   BILIRUBINUR NEGATIVE 06/09/2020 1240   KETONESUR 80 (A) 06/09/2020 1240   PROTEINUR NEGATIVE 06/09/2020 1240   NITRITE NEGATIVE 06/09/2020 1240   LEUKOCYTESUR NEGATIVE 06/09/2020 1240   Sepsis Labs: No results found for this or any previous visit (from the past 240 hour(s)).   Radiological Exams on Admission: CT Head Wo Contrast  Result Date: 06/09/2020 CLINICAL DATA:  Dizziness EXAM: CT HEAD WITHOUT CONTRAST TECHNIQUE: Contiguous axial images were obtained from the base of the skull through the vertex without  intravenous contrast. COMPARISON:  None. FINDINGS: Brain: Ventricles and sulci are normal in size and configuration. There is no intracranial mass, hemorrhage, extra-axial fluid collection, or midline shift. Brain parenchyma appears unremarkable. No evident acute infarct. Vascular: No hyperdense vessel.  No evident vascular calcification. Skull: Bony calvarium appears intact. Sinuses/Orbits: Visualized paranasal sinuses are clear. Orbits appear symmetric bilaterally. Other: Mastoid air cells are clear. IMPRESSION: Study within normal limits. Electronically Signed   By: Bretta Bang III M.D.   On: 06/09/2020 14:41    EKG: Independently reviewed.  Sinus tachycardia 111 bpm  Assessment/Plan DKA type II: Acute.  Patient presented with complaints dizziness with polyuria and probably polydipsia. Glucose elevated up to 1434, CO2 11, beta hydroxybutyrate >8, and anion gap 32. Urinalysis was positive for glucose and ketones.  Venous pH was 7.295.  She has no prior history of diabetes.  She was given fluid bolus and started on insulin drip. -Admit to stepdown unit  -Hyperglycemia order set utilized -Check serial BMPs  and beta hydroxybutyrate -Check hemoglobin A1c -Correct electrolytes as needed -Diabetes education consult for new onset diabetes  Metabolic acidosis with elevated anion gap: Acute.  Secondary to above. -Monitoring for AG closure and will transition to subcutaneous insulin   Leukocytosis: Acute. Wbc 15.9, but suspect secondary to above.  Urine did not show any clear signs of infection and lungs sound clear on physical exam. -Recheck CBC in a.m.  Metabolic encephalopathy: Patient appears to be somewhat confused.  Suspect multifactorial in the setting of hyperglycemia, CBD pills, and/or Flexeril. -Neuro check -Follow-up urine drug screen  Acute kidney injury: On admission patient presents with creatinine 2.37 with BUN 37.  Suspect secondary to dehydration given uncontrolled  diabetes. -Continue IV fluids -Continue to monitor kidney function  Polycythemia: Acute.  On admission hemoglobin elevated at 17.2.  Suspect secondary to hemoconcentration related with dehydration. -Continue monitor with rehydration -May warrant further work-up in outpatient setting  Sciatica on left side: Patient had recently had complaints of sciatic nerve pain down the left. -May consider PT consult once presenting issue resolved  Obesity: BMI 35.12 kg/m2  DVT prophylaxis: Lovenox Code Status: Full Family Communication: Patient's mother updated over the phone Disposition Plan: Hopefully discharge home once medically Consults called: Diabetic education Admission status: inpatient    Clydie Braun MD [facility]   If 7PM-7AM, please contact night-coverage   06/09/2020, 3:05 PM

## 2020-06-09 NOTE — ED Notes (Signed)
With the patient permission call mother for an update  her name Whitney Baker [phone]

## 2020-06-09 NOTE — ED Notes (Signed)
Both pt IV had been in [facility] IV.  Pt had been in a position that did not allow IV fluid to infuse to gravity (Fluids were to gravity).  Pump modules attached and fluids now infusing on pump at this time.  Pt continues to be quite confused in her speech, she reports that she is very hungry and is not pleased about having to stay in the hospital.

## 2020-06-09 NOTE — ED Notes (Signed)
Pt sheets and gown changes after accident voiding

## 2020-06-09 NOTE — ED Notes (Signed)
Attempted to call report x 1  

## 2020-06-09 NOTE — ED Notes (Signed)
Placed pt on purewick with chucks under her.  Fluid bolus continues to infuse, RN at bedside as IV positional.

## 2020-06-09 NOTE — ED Notes (Signed)
Critical Labs was given to Nurse.

## 2020-06-09 NOTE — ED Triage Notes (Signed)
Patient arrived by [facility] with complaint of dizziness while at work today. Patient reports increased thirst and increased frequency of urination. BS reads high and no hx of diabetes

## 2020-06-09 NOTE — ED Notes (Signed)
Mother Maple [place] [phone] would like an update when possible

## 2020-06-09 NOTE — ED Notes (Signed)
Placed pt back on pure wick.   

## 2020-06-09 NOTE — ED Notes (Signed)
RN remains at the bedside to ensure all fluid infuses as it continues to be difficult for pt to recall that her arms must remain extended while the bolus is infusing.

## 2020-06-09 NOTE — ED Provider Notes (Signed)
[facility]   CSN: [id number] Arrival date & time: 06/09/20  1233     History No chief complaint on file.   Whitney Baker is a 45 y.o. female with a past medical history significant for asthma who presents to the ED via EMS due to dizziness.  Patient states she has been feeling "wobbly" for the past 2 weeks which she attributes to her left sciatica. She has been taking flexeril with improvement in left sided low back pain.  During initial evaluation difficult to obtain HPI due to altered mental status. Patient continues to repeat words; however is alert and oriented x4.  She notes she has had increased thirst, increased urination, and bilateral blurry vision for the past 2 weeks.  Denies history of diabetes however, notes she has not seen a PCP in awhile. Denies abdominal pain, nausea, vomiting, and diarrhea. Denies fever and chills. Patient states she took a CBD pill 1 week ago which made her feel abnormal and possibly this morning?   Level 5 caveat secondary to altered mental status.  History obtained from patient and past medical records. No interpreter used during encounter.      History reviewed. No pertinent past medical history.  Patient Active Problem List   Diagnosis Date Noted  . DKA (diabetic ketoacidosis) (HCC) 06/09/2020  . Anal fissure 10/02/2017  . 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 Hidradenitis suppurativa 10/02/2017  . Asthma 02/02/2017    History reviewed. No pertinent surgical history.   OB History   No obstetric history on file.     No family history on file.  Social History   Tobacco Use  . Smoking status: Former Games developermoker  . Smokeless tobacco: Never Used  Substance Use Topics  . Alcohol use: Yes    Comment: occas  . Drug use: Never    Home Medications Prior to Admission medications   Medication Sig Start Date End Date Taking? Authorizing Provider  clindamycin (CLEOCIN) 300  MG capsule clindamycin HCl 300 mg capsule  TAKE 1 CAPSULE BY MOUTH TWICE A DAY    [provider]  cyclobenzaprine (FLEXERIL) 10 MG tablet Take 1 tablet (10 mg total) by mouth 3 (three) times daily as needed for muscle spasms. DO NOT DRINK ALCOHOL OR DRIVE WHILE TAKING THIS MEDICATION 05/24/20   Particia NearingLane, Rachel Elizabeth, PA-C  Norethindrone-Ethinyl Estradiol-Fe Biphas (LO LOESTRIN FE) 1 MG-10 MCG / 10 MCG tablet Lo Loestrin Fe 1 mg-10 mcg (24)/10 mcg (2) tablet    [provider]  predniSONE (DELTASONE) 20 MG tablet Take 2 tablets (40 mg total) by mouth daily with breakfast. 05/24/20   Particia NearingLane, Rachel Elizabeth, PA-C  rifampin (RIFADIN) 300 MG capsule Take 300 mg by mouth 2 (two) times daily. 09/10/17   [provider]    Allergies    Patient has no known allergies.  Review of Systems   Review of Systems  Unable to perform ROS: Mental status change  Neurological: Positive for dizziness.    Physical Exam Updated Vital Signs BP (!) 128/97 Comment: RA  Pulse (!) 107 Comment: RA  Temp 98 F (36.7 C)   Resp 20 Comment: RA  Ht 5\' 8"  (1.727 m)   Wt 104.8 kg   SpO2 98% Comment: RA  BMI 35.12 kg/m   Physical Exam Vitals and nursing note reviewed.  Constitutional:      General: She is not in acute distress.    Appearance: She is not toxic-appearing.  HENT:     Head: Normocephalic.     Mouth/Throat:     Comments: Dry mucus membranes Eyes:     Pupils: Pupils are equal, round, and reactive to light.  Cardiovascular:     Rate and Rhythm: Normal rate and regular rhythm.     Pulses: Normal pulses.     Heart sounds: Normal heart sounds. No murmur heard. No friction rub. No gallop.   Pulmonary:     Effort: Pulmonary effort is normal.     Breath sounds: Normal breath sounds.     Comments: Respirations equal and unlabored, patient able to speak in full sentences, lungs clear to auscultation bilaterally Abdominal:     General: Abdomen is flat. Bowel sounds are normal.  There is no distension.     Palpations: Abdomen is soft.     Tenderness: There is no abdominal tenderness. There is no guarding or rebound.     Comments: Abdomen soft, nondistended, nontender to palpation in all quadrants without guarding or peritoneal signs. No rebound.   Musculoskeletal:     Cervical back: Neck supple.     Comments: Able to move all 4 extremities without difficulty. No thoracic or lumbar midline tenderness. Mild reproducible tenderness in left paraspinal region.  Skin:    General: Skin is warm and dry.  Neurological:     General: No focal deficit present.     Mental Status: She is alert.     Comments: Repeats words Speech is clear, no facial droop Difficult to follow simple commands CN III-XII intact Normal strength in upper and lower extremities bilaterally including dorsiflexion and plantar flexion, strong and equal grip strength Sensation grossly intact throughout Moves extremities without ataxia, coordination intact No pronator drift Ambulates without difficulty      ED Results / Procedures / Treatments   Labs (all labs ordered are listed, but only abnormal results are displayed) Labs Reviewed  BASIC METABOLIC PANEL - Abnormal; Notable for the following components:      Result Value   Chloride 93 (*)    CO2 11 (*)    Glucose, Bld 1,434 (*)    BUN 37 (*)    Creatinine, Ser 2.37 (*)    Calcium 10.4 (*)    GFR, Estimated 25 (*)    Anion gap 32 (*)    All other components within normal limits  CBC - Abnormal; Notable for the following components:   WBC 15.9 (*)    RBC 6.24 (*)    Hemoglobin 17.2 (*)    HCT 55.4 (*)    All other components within normal limits  URINALYSIS, ROUTINE W REFLEX MICROSCOPIC - Abnormal; Notable for the following components:   Color, Urine STRAW (*)    Glucose, UA >=500 (*)    Hgb urine dipstick SMALL (*)    Ketones, ur 80 (*)    Bacteria, UA RARE (*)    All other components within normal limits  BETA-HYDROXYBUTYRIC ACID  - Abnormal; Notable for the following components:   Beta-Hydroxybutyric Acid >8.00 (*)    All other components within normal limits  CBG MONITORING, ED - Abnormal; Notable for the following components:   Glucose-Capillary >600 (*)    All other components within normal limits  CBG MONITORING, ED - Abnormal; Notable for the following components:   Glucose-Capillary >600 (*)    All other components within normal limits  I-STAT CHEM 8, ED - Abnormal; Notable for the following components:   BUN 41 (*)    Creatinine,  Ser 1.60 (*)    Glucose, Bld >700 (*)    TCO2 15 (*)    Hemoglobin 18.7 (*)    HCT 55.0 (*)    All other components within normal limits  I-STAT VENOUS BLOOD GAS, ED - Abnormal; Notable for the following components:   pCO2, Ven 27.0 (*)    pO2, Ven 54.0 (*)    Bicarbonate 13.2 (*)    TCO2 14 (*)    Acid-base deficit 11.0 (*)    HCT 52.0 (*)    Hemoglobin 17.7 (*)    All other components within normal limits  RESP PANEL BY RT-PCR (FLU A&B, COVID) ARPGX2  RAPID URINE DRUG SCREEN, HOSP PERFORMED  I-STAT BETA HCG BLOOD, ED ([facility], [facility], [facility] ONLY)    EKG EKG Interpretation  Date/Time:  Wednesday June 09 2020 12:39:10 EDT Ventricular Rate:  111 PR Interval:  124 QRS Duration: 74 QT Interval:  320 QTC Calculation: 435 R Axis:   29 Text Interpretation: Sinus tachycardia Possible Left atrial enlargement Borderline ECG Confirmed by Kristine Royal [phone]) on 06/09/2020 1:04:40 PM   Radiology CT Head Wo Contrast  Result Date: 06/09/2020 CLINICAL DATA:  Dizziness EXAM: CT HEAD WITHOUT CONTRAST TECHNIQUE: Contiguous axial images were obtained from the base of the skull through the vertex without intravenous contrast. COMPARISON:  None. FINDINGS: Brain: Ventricles and sulci are normal in size and configuration. There is no intracranial mass, hemorrhage, extra-axial fluid collection, or midline shift. Brain parenchyma appears unremarkable. No evident acute infarct. Vascular: No  hyperdense vessel.  No evident vascular calcification. Skull: Bony calvarium appears intact. Sinuses/Orbits: Visualized paranasal sinuses are clear. Orbits appear symmetric bilaterally. Other: Mastoid air cells are clear. IMPRESSION: Study within normal limits. Electronically Signed   By: Bretta Bang III M.D.   On: 06/09/2020 14:41   DG Chest Port 1 View  Result Date: 06/09/2020 CLINICAL DATA:  Dyspnea, dizziness, polydipsia EXAM: PORTABLE CHEST 1 VIEW COMPARISON:  None. FINDINGS: The heart size and mediastinal contours are within normal limits. Both lungs are clear. The visualized skeletal structures are unremarkable. IMPRESSION: No active disease. Electronically Signed   By: Sharlet Salina M.D.   On: 06/09/2020 15:12    Procedures .Critical Care Performed by: Mannie Stabile, PA-C Authorized by: Mannie Stabile, PA-C   Critical care provider statement:    Critical care time (minutes):  45   Critical care time was exclusive of:  Separately billable procedures and treating other patients and teaching time   Critical care was necessary to treat or prevent imminent or life-threatening deterioration of the following conditions:  Endocrine crisis and dehydration   Critical care was time spent personally by me on the following activities:  Discussions with consultants, evaluation of patient's response to treatment, examination of patient, ordering and performing treatments and interventions, ordering and review of laboratory studies, ordering and review of radiographic studies, pulse oximetry, re-evaluation of patient's condition, obtaining history from patient or surrogate and review of old charts   I assumed direction of critical care for this patient from another provider in my specialty: no     Care discussed with: admitting provider       Medications Ordered in ED Medications  insulin regular, human (MYXREDLIN) 100 units/ 100 mL infusion (5 Units/hr Intravenous New Bag/Given  06/09/20 1442)  lactated ringers infusion (has no administration in time range)  dextrose 5 % in lactated ringers infusion (has no administration in time range)  dextrose 50 % solution 0-50 mL (has no administration  in time range)  potassium chloride 10 mEq in 100 mL IVPB (10 mEq Intravenous New Bag/Given 06/09/20 1506)  sodium chloride 0.9 % bolus 1,000 mL (1,000 mLs Intravenous New Bag/Given 06/09/20 1340)  lactated ringers bolus 2,500 mL (2,500 mLs Intravenous New Bag/Given 06/09/20 1447)    ED Course  I have reviewed the triage vital signs and the nursing notes.  Pertinent labs & imaging results that were available during my care of the patient were reviewed by me and considered in my medical decision making (see chart for details).  Clinical Course as of 06/09/20 1519  Wed Jun 09, 2020  1256 Glucose-Capillary(!!): >600 [CA]  1413 Glucose(!!): >700 [CA]  1413 Potassium: 4.9 [CA]  1413 Creatinine(!): 1.60 [CA]  1413 pH, Ven: 7.295 [CA]  1413 pCO2, Ven(!): 27.0 [CA]  1413 Bicarbonate(!): 13.2 [CA]  1413 Glucose(!!): 1,434 [CA]  1433 Anion gap(!): 32 [CA]  1508 Beta-Hydroxybutyric Acid(!): >8.00 [CA]    Clinical Course User Index [CA] Jesusita Oka   MDM Rules/Calculators/A&P                         45 year old female presents to the ED via EMS due to dizziness.  During initial evaluation, patient appears altered; however is AAOx4. She continuously repeats words. EMS found patient to be severely hyperglycemic. No history of diabetes. Upon arrival, patient afebrile, tachycardic at 111 with otherwise reassuring vitals. Abdomen soft, non-distended, non-tender. No neurological deficits noted on exam except repetitive speech and slow to respond. Dry mucus membranes. Suspect AMS related to hyperglycemia in the setting of polypharmacy with flexeril and CBD. Routine labs ordered. IVFs started. CT head due to AMS to rule out acute intracranial etiologies. Discussed case with Dr.  Rodena Medin who evaluated patient directly after initial evaluation who agrees with assessment and plan.    CMP significant for hyperglycemia at 1434 with an anion gap of 32.  AKI likely prerenal with creatinine at 2.37 and BUN at 37.  Pregnancy test negative.  Beta hydroxybutyrate >8. CBC with leukocytosis of 15.9 and hemoglobin at 17.2 likely due to hemoconcentration. IVFs, potassium, and insulin started.  CT head personally reviewed which is negative for any acute abnormalities.  EKG personally reviewed which demonstrates sinus tachycardia with no signs of acute ischemia.  Chest x-ray personally reviewed which is negative for signs of pneumonia, pneumothorax, or widened mediastinum.  Suspect symptoms related to DKA secondary to undiagnosed diabetes.   3:08 PM Discussed case with Dr. Katrinka Blazing with [facility] who agrees to admit patient for further treatment. COVID pending. Final Clinical Impression(s) / ED Diagnoses Final diagnoses:  Diabetic ketoacidosis without coma associated with other specified diabetes mellitus [facility])    Rx / DC Orders ED Discharge Orders    None       Jesusita Oka 06/09/20 1519    Wynetta Fines, MD 06/10/20 1500

## 2020-06-09 NOTE — ED Notes (Signed)
Taking pt to yellow to get a weight

## 2020-06-09 NOTE — ED Notes (Signed)
Patient transported to CT 

## 2020-06-09 NOTE — ED Notes (Signed)
Per endo tool - CBG - 374 - No change in rate on insulin.

## 2020-06-09 NOTE — ED Notes (Signed)
Pt got up to [facility] to void as purewick did not work for her.  Pt states that she feels "much better" and she seems clearer (previously having a hard time expressing herself and finding words)

## 2020-06-09 NOTE — ED Notes (Signed)
AT 1500 when I took over care pt was restless, confused, speech slurred and searching for words.  Pt is now alert and oriented, speech clear and pt is calm.

## 2020-06-09 NOTE — ED Notes (Signed)
2.5L LR Bolus complete at this time, BMET that was already scheduled at this time drawn (no additional order was required) IV on LAC redressed.

## 2020-06-09 NOTE — ED Notes (Signed)
Blood drawn and sent.

## 2020-06-10 DIAGNOSIS — D751 Secondary polycythemia: Secondary | ICD-10-CM

## 2020-06-10 DIAGNOSIS — E669 Obesity, unspecified: Secondary | ICD-10-CM

## 2020-06-10 LAB — CBC
HCT: 47.4 % — ABNORMAL HIGH (ref 36.0–46.0)
Hemoglobin: 15.5 g/dL — ABNORMAL HIGH (ref 12.0–15.0)
MCH: 27.1 pg (ref 26.0–34.0)
MCHC: 32.7 g/dL (ref 30.0–36.0)
MCV: 82.7 fL (ref 80.0–100.0)
Platelets: 217 10*3/uL (ref 150–400)
RBC: 5.73 MIL/uL — ABNORMAL HIGH (ref 3.87–5.11)
RDW: 13 % (ref 11.5–15.5)
WBC: 15.4 10*3/uL — ABNORMAL HIGH (ref 4.0–10.5)
nRBC: 0 % (ref 0.0–0.2)

## 2020-06-10 LAB — BASIC METABOLIC PANEL
Anion gap: 11 (ref 5–15)
Anion gap: 10 (ref 5–15)
Anion gap: 11 (ref 5–15)
BUN: 22 mg/dL — ABNORMAL HIGH (ref 6–20)
BUN: 24 mg/dL — ABNORMAL HIGH (ref 6–20)
BUN: 22 mg/dL — ABNORMAL HIGH (ref 6–20)
CO2: 26 mmol/L (ref 22–32)
CO2: 25 mmol/L (ref 22–32)
CO2: 27 mmol/L (ref 22–32)
Calcium: 9.7 mg/dL (ref 8.9–10.3)
Calcium: 9.5 mg/dL (ref 8.9–10.3)
Calcium: 9.6 mg/dL (ref 8.9–10.3)
Chloride: 115 mmol/L — ABNORMAL HIGH (ref 98–111)
Chloride: 118 mmol/L — ABNORMAL HIGH (ref 98–111)
Chloride: 115 mmol/L — ABNORMAL HIGH (ref 98–111)
Creatinine, Ser: 1.36 mg/dL — ABNORMAL HIGH (ref 0.44–1.00)
Creatinine, Ser: 1.16 mg/dL — ABNORMAL HIGH (ref 0.44–1.00)
Creatinine, Ser: 1.16 mg/dL — ABNORMAL HIGH (ref 0.44–1.00)
GFR, Estimated: 50 mL/min — ABNORMAL LOW (ref >60)
GFR, Estimated: 60 mL/min — ABNORMAL LOW (ref >60)
GFR, Estimated: 60 mL/min — ABNORMAL LOW (ref >60)
Glucose, Bld: 343 mg/dL — ABNORMAL HIGH (ref 70–99)
Glucose, Bld: 254 mg/dL — ABNORMAL HIGH (ref 70–99)
Glucose, Bld: 245 mg/dL — ABNORMAL HIGH (ref 70–99)
Potassium: 4.1 mmol/L (ref 3.5–5.1)
Potassium: 3.4 mmol/L — ABNORMAL LOW (ref 3.5–5.1)
Potassium: 3.1 mmol/L — ABNORMAL LOW (ref 3.5–5.1)
Sodium: 152 mmol/L — ABNORMAL HIGH (ref 135–145)
Sodium: 153 mmol/L — ABNORMAL HIGH (ref 135–145)
Sodium: 153 mmol/L — ABNORMAL HIGH (ref 135–145)

## 2020-06-10 LAB — GLUCOSE, CAPILLARY
Glucose-Capillary: 188 mg/dL — ABNORMAL HIGH (ref 70–99)
Glucose-Capillary: 215 mg/dL — ABNORMAL HIGH (ref 70–99)
Glucose-Capillary: 222 mg/dL — ABNORMAL HIGH (ref 70–99)
Glucose-Capillary: 227 mg/dL — ABNORMAL HIGH (ref 70–99)
Glucose-Capillary: 234 mg/dL — ABNORMAL HIGH (ref 70–99)
Glucose-Capillary: 240 mg/dL — ABNORMAL HIGH (ref 70–99)
Glucose-Capillary: 242 mg/dL — ABNORMAL HIGH (ref 70–99)
Glucose-Capillary: 254 mg/dL — ABNORMAL HIGH (ref 70–99)
Glucose-Capillary: 259 mg/dL — ABNORMAL HIGH (ref 70–99)
Glucose-Capillary: 266 mg/dL — ABNORMAL HIGH (ref 70–99)
Glucose-Capillary: 278 mg/dL — ABNORMAL HIGH (ref 70–99)
Glucose-Capillary: 288 mg/dL — ABNORMAL HIGH (ref 70–99)
Glucose-Capillary: 313 mg/dL — ABNORMAL HIGH (ref 70–99)
Glucose-Capillary: 313 mg/dL — ABNORMAL HIGH (ref 70–99)
Glucose-Capillary: 409 mg/dL — ABNORMAL HIGH (ref 70–99)

## 2020-06-10 LAB — BETA-HYDROXYBUTYRIC ACID
Beta-Hydroxybutyric Acid: 3.71 mmol/L — ABNORMAL HIGH (ref 0.05–0.27)
Beta-Hydroxybutyric Acid: 0.88 mmol/L — ABNORMAL HIGH (ref 0.05–0.27)

## 2020-06-10 LAB — HEPATIC FUNCTION PANEL
ALT: 19 U/L (ref 0–44)
AST: 17 U/L (ref 15–41)
Albumin: 3.1 g/dL — ABNORMAL LOW (ref 3.5–5.0)
Alkaline Phosphatase: 95 U/L (ref 38–126)
Bilirubin, Direct: <0.1 mg/dL (ref 0.0–0.2)
Total Bilirubin: 0.6 mg/dL (ref 0.3–1.2)
Total Protein: 6 g/dL — ABNORMAL LOW (ref 6.5–8.1)

## 2020-06-10 LAB — HEMOGLOBIN A1C
Hgb A1c MFr Bld: >15.5 % — ABNORMAL HIGH (ref 4.8–5.6)
Mean Plasma Glucose: >398 mg/dL

## 2020-06-10 LAB — MRSA PCR SCREENING: MRSA by PCR: NEGATIVE

## 2020-06-10 MED ORDER — INSULIN ASPART 100 UNIT/ML ~~LOC~~ SOLN
5.0000 [IU] | Freq: Three times a day (TID) | SUBCUTANEOUS | Status: DC
  Administered 2020-06-10: 5 [IU] via SUBCUTANEOUS

## 2020-06-10 MED ORDER — INSULIN STARTER KIT- PEN NEEDLES (ENGLISH)
1.0000 | Freq: Once | Status: AC
  Administered 2020-06-10: 1
  Filled 2020-06-10: qty 1

## 2020-06-10 MED ORDER — DEXTROSE 5 % IV SOLN: INTRAVENOUS | Status: DC

## 2020-06-10 MED ORDER — INSULIN ASPART 100 UNIT/ML ~~LOC~~ SOLN
8.0000 [IU] | Freq: Once | SUBCUTANEOUS | Status: AC
  Administered 2020-06-10: 8 [IU] via SUBCUTANEOUS

## 2020-06-10 MED ORDER — INSULIN ASPART 100 UNIT/ML ~~LOC~~ SOLN
0.0000 [IU] | Freq: Three times a day (TID) | SUBCUTANEOUS | Status: DC
  Administered 2020-06-10: 5 [IU] via SUBCUTANEOUS
  Administered 2020-06-11: 11 [IU] via SUBCUTANEOUS
  Administered 2020-06-11: 15 [IU] via SUBCUTANEOUS
  Administered 2020-06-11: 11 [IU] via SUBCUTANEOUS
  Administered 2020-06-12: 8 [IU] via SUBCUTANEOUS
  Administered 2020-06-12: 5 [IU] via SUBCUTANEOUS

## 2020-06-10 MED ORDER — INSULIN GLARGINE 100 UNIT/ML ~~LOC~~ SOLN
20.0000 [IU] | Freq: Every day | SUBCUTANEOUS | Status: DC
  Administered 2020-06-10: 20 [IU] via SUBCUTANEOUS
  Filled 2020-06-10 (×2): qty 0.2

## 2020-06-10 MED ORDER — INSULIN ASPART 100 UNIT/ML ~~LOC~~ SOLN: 0.0000 [IU] | Freq: Every day | SUBCUTANEOUS | Status: DC

## 2020-06-10 MED ORDER — LIVING WELL WITH DIABETES BOOK
Freq: Once | Status: AC
  Filled 2020-06-10: qty 1

## 2020-06-10 NOTE — Progress Notes (Signed)
PROGRESS NOTE    Whitney Baker  FOY:[id number] DOB: 1975-05-20 DOA: 06/09/2020 PCP: Patient, No Pcp Per    Brief Narrative:  Whitney Baker is a 45 year old female with past medical history significant for sciatica, hidradenitis, obesity who presented to the ED with complaints of dizziness while at work.  History is limited due to patient's confusion on presentation.  Patient reports recent issues with sciatica on the left side, recently prescribed prednisone and prior to ED presentation took a Flexeril.  Patient reports increased thirst, urinary frequency, blurred vision, weight loss.  Patient's mother confirms that she has lost a significant amount of weight from 295 pounds down to 230 pounds over the last month.  Patient currently not seen regularly by PCP.  Upon admission to the emergency department patient was seen to be afebrile, pulse 10 7-111, respirations 16-24, blood pressure 115/97-130 3/110, and O2 saturation maintained on room air. CT scan of the brain without contrast was negative.  Labs significant for WBC 15.9, hemoglobin 17.2 sodium 136, chloride 93, CO2 11, BUN 37, creatinine 2.37, glucose 1434, calcium 10.4, anion gap 32, and beta hydroxybutyrate greater than 8.  Urinalysis positive for glucose and ketones.  Patient was given bolus of 3.5 L of IV fluids, 20 mEq of potassium, and started on insulin drip.   Assessment & Plan:   Principal Problem:   DKA (diabetic ketoacidosis) ([place]) Active Problems:   Obesity (BMI 30-39.9)   Leukocytosis   AKI (acute kidney injury) ([place])   Acute metabolic encephalopathy   Polycythemia   Sciatic neuropathy, left   Acute metabolic encephalopathy: POA High anion gap metabolic acidosis Patient appears to be somewhat confused.  Suspect multifactorial in the setting of hyperglycemia, acute renal failure, CBD pills, and/or Flexeril.  UDS positive for THC. --Continue treatment as below  DKA type II: Acute.   Patient presented with  complaints dizziness with polyuria and probably polydipsia. Glucose elevated up to 1434, CO2 11, beta hydroxybutyrate >8, and anion gap 32. Urinalysis was positive for glucose and ketones.  Venous pH was 7.295.  She has no prior history of diabetes. --Diabetic educator following, appreciate assistance --Hemoglobin A1c: Pending --Glucose 1434>>254 this am --anion gap 32>>10 --Beta hydroxybutyrate >8, now down to 0.88 --Transition insulin drip to Lantus 20 units [place] daily, Novolog 5u TIDAC --Moderate SSI for further coverage --CBGs before every meal/at bedtime --Nutrition consult for diet education  Leukocytosis:  Wbc 15.9, but suspect secondary to above.  Urine did not show any clear signs of infection and lungs sound clear on physical exam. --WBC 15.5>15.4 --CBC in am  Acute kidney injury:  On admission patient presents with creatinine 2.37 with BUN 37.  Suspect secondary to dehydration given uncontrolled diabetes. --Cr 3.37>1.16 --Continue IV fluids --BMP daily  Polycythemia: Acute.   On admission hemoglobin elevated at 17.2.  Suspect secondary to hemoconcentration related with dehydration. --Hemoglobin 17.2> 15.5 --Repeat CBC in a.m.  Sciatica on left side:  Patient had recently had complaints of sciatic nerve pain down the left.  Recently prescribed Flexeril and prednisone. --PT evaluation  Obesity:  BMI 35.12 kg/m2.  Discussed with patient needs for aggressive lifestyle changes/weight loss as this complicates all facets of care.  Outpatient follow-up with PCP.  May benefit from bariatric evaluation outpatient.   DVT prophylaxis: Lovenox   Code Status: Full Code Family Communication: Updated patient's sister present at bedside  Disposition Plan:  Level of care: Progressive Status is: Inpatient  Remains inpatient appropriate because:Altered mental status, Ongoing diagnostic testing  needed not appropriate for outpatient work up, Unsafe d/c plan, IV treatments  appropriate due to intensity of illness or inability to take PO and Inpatient level of care appropriate due to severity of illness   Dispo: The patient is from: Home              Anticipated d/c is to: Home              Patient currently is not medically stable to d/c.   Difficult to place patient No   Consultants:   None  Procedures:   None  Antimicrobials:   None   Subjective: Patient seen and examined bedside, resting comfortably.  Continues to be confused.  Glucose now better controlled with closure of anion gap.  Will transition insulin drip to subcutaneous insulin today.  Sister present at bedside and updated extensively regarding plan of care.  Will need at least another 1-2 days of inpatient titration of insulin regimen given new diagnosis of diabetes, and patient continues with confusion and will need to be able to administer insulin independently.  Patient states she is hungry wants to eat something.  No other questions or concerns at this time.  Denies headache, no chest pain, palpitations, no shortness of breath, no abdominal pain.  No acute events overnight per nursing staff.  Objective: Vitals:   06/09/20 2200 06/09/20 2207 06/09/20 2311 06/10/20 0800  BP: (!) 134/100  130/89   Pulse: (!) 106  (!) 105 (!) 106  Resp: $Remo'19  13 16  'tFDQb$ Temp:  97.8 F (36.6 C) 98.6 F (37 C) 98.5 F (36.9 C)  TempSrc:  Oral Oral Oral  SpO2: 98%  96% 100%  Weight:   107.8 kg   Height:   '5\' 6"'$  (1.676 m)     Intake/Output Summary (Last 24 hours) at 06/10/2020 1025 Last data filed at 06/10/2020 0600 Gross per 24 hour  Intake 2337.42 ml  Output --  Net 2337.42 ml   Filed Weights   06/09/20 1433 06/09/20 2311  Weight: 104.8 kg 107.8 kg    Examination:  General exam: Appears calm and comfortable, confused, obese Respiratory system: Clear to auscultation. Respiratory effort normal.  On room air Cardiovascular system: S1 & S2 heard, RRR. No JVD, murmurs, rubs, gallops or clicks. No  pedal edema. Gastrointestinal system: Abdomen is nondistended, soft and nontender. No organomegaly or masses felt. Normal bowel sounds heard. Central nervous system: Alert. No focal neurological deficits. Extremities: Symmetric 5 x 5 power. Skin: No rashes, lesions or ulcers Psychiatry: Judgement and insight appear poor. Mood & affect appropriate.     Data Reviewed: I have personally reviewed following labs and imaging studies  CBC: Recent Labs  Lab 06/09/20 1240 06/09/20 1325 06/09/20 1330 06/10/20 0344  WBC 15.9*  --   --  15.4*  HGB 17.2* 18.7* 17.7* 15.5*  HCT 55.4* 55.0* 52.0* 47.4*  MCV 88.8  --   --  82.7  PLT 321  --   --  412   Basic Metabolic Panel: Recent Labs  Lab 06/09/20 1700 06/09/20 1701 06/09/20 2326 06/10/20 0344 06/10/20 0755  NA 147* 151* 152* 153* 153*  K 4.0 3.8 4.1 3.4* 3.1*  CL 107 114* 115* 118* 115*  CO2 17* 19* $Remov'26 25 27  'RkTLUt$ GLUCOSE 731* 480* 343* 254* 245*  BUN 34* 23* 22* 24* 22*  CREATININE 2.17* 1.65* 1.36* 1.16* 1.16*  CALCIUM 9.8 9.7 9.7 9.5 9.6   GFR: Estimated Creatinine Clearance: 77.7 mL/min (A) (by C-G formula  based on SCr of 1.16 mg/dL (H)). Liver Function Tests: Recent Labs  Lab 06/10/20 0344  AST 17  ALT 19  ALKPHOS 95  BILITOT 0.6  PROT 6.0*  ALBUMIN 3.1*   No results for input(s): LIPASE, AMYLASE in the last 168 hours. No results for input(s): AMMONIA in the last 168 hours. Coagulation Profile: No results for input(s): INR, PROTIME in the last 168 hours. Cardiac Enzymes: No results for input(s): CKTOTAL, CKMB, CKMBINDEX, TROPONINI in the last 168 hours. BNP (last 3 results) No results for input(s): PROBNP in the last 8760 hours. HbA1C: No results for input(s): HGBA1C in the last 72 hours. CBG: Recent Labs  Lab 06/10/20 0533 06/10/20 0627 06/10/20 0748 06/10/20 0855 06/10/20 1015  GLUCAP 242* 288* 240* 234* 227*   Lipid Profile: No results for input(s): CHOL, HDL, LDLCALC, TRIG, CHOLHDL, LDLDIRECT in the  last 72 hours. Thyroid Function Tests: No results for input(s): TSH, T4TOTAL, FREET4, T3FREE, THYROIDAB in the last 72 hours. Anemia Panel: No results for input(s): VITAMINB12, FOLATE, FERRITIN, TIBC, IRON, RETICCTPCT in the last 72 hours. Sepsis Labs: No results for input(s): PROCALCITON, LATICACIDVEN in the last 168 hours.  Recent Results (from the past 240 hour(s))  MRSA PCR Screening     Status: None   Collection Time: 06/09/20  2:50 AM   Specimen: Nasopharyngeal  Result Value Ref Range Status   MRSA by PCR NEGATIVE NEGATIVE Final    Comment:        The GeneXpert MRSA Assay (FDA approved for NASAL specimens only), is one component of a comprehensive MRSA colonization surveillance program. It is not intended to diagnose MRSA infection nor to guide or monitor treatment for MRSA infections. Performed at [facility], [place] [address] [place], [place] [address]   Resp Panel by RT-PCR (Flu A&B, Covid) Nasopharyngeal Swab     Status: None   Collection Time: 06/09/20  1:52 PM   Specimen: Nasopharyngeal Swab; Nasopharyngeal(NP) swabs in vial transport medium  Result Value Ref Range Status   SARS Coronavirus 2 by RT PCR NEGATIVE NEGATIVE Final    Comment: (NOTE) SARS-CoV-2 target nucleic acids are NOT DETECTED.  The SARS-CoV-2 RNA is generally detectable in upper respiratory specimens during the acute phase of infection. The lowest concentration of SARS-CoV-2 viral copies this assay can detect is 138 copies/mL. A negative result does not preclude SARS-Cov-2 infection and should not be used as the sole basis for treatment or other patient management decisions. A negative result may occur with  improper specimen collection/handling, submission of specimen other than nasopharyngeal swab, presence of viral mutation(s) within the areas targeted by this assay, and inadequate number of viral copies(<138 copies/mL). A negative result must be combined with clinical observations,  patient history, and epidemiological information. The expected result is Negative.  Fact Sheet for Patients:  EntrepreneurPulse.com.au  Fact Sheet for Healthcare Providers:  IncredibleEmployment.be  This test is no t yet approved or cleared by the [place] FDA and  has been authorized for detection and/or diagnosis of SARS-CoV-2 by FDA under an Emergency Use Authorization (EUA). This EUA will remain  in effect (meaning this test can be used) for the duration of the COVID-19 declaration under Section 564(b)(1) of the Act, 21 U.S.C.section 360bbb-3(b)(1), unless the authorization is terminated  or revoked sooner.       Influenza A by PCR NEGATIVE NEGATIVE Final   Influenza B by PCR NEGATIVE NEGATIVE Final    Comment: (NOTE) The Xpert Xpress SARS-CoV-2/FLU/RSV plus assay is  intended as an aid in the diagnosis of influenza from Nasopharyngeal swab specimens and should not be used as a sole basis for treatment. Nasal washings and aspirates are unacceptable for Xpert Xpress SARS-CoV-2/FLU/RSV testing.  Fact Sheet for Patients: EntrepreneurPulse.com.au  Fact Sheet for Healthcare Providers: IncredibleEmployment.be  This test is not yet approved or cleared by the [place] FDA and has been authorized for detection and/or diagnosis of SARS-CoV-2 by FDA under an Emergency Use Authorization (EUA). This EUA will remain in effect (meaning this test can be used) for the duration of the COVID-19 declaration under Section 564(b)(1) of the Act, 21 U.S.C. section 360bbb-3(b)(1), unless the authorization is terminated or revoked.  Performed at [facility], [place] [address] [place], [place] [address]          Radiology Studies: CT Head Wo Contrast  Result Date: 06/09/2020 CLINICAL DATA:  Dizziness EXAM: CT HEAD WITHOUT CONTRAST TECHNIQUE: Contiguous axial images were obtained from the base of the  skull through the vertex without intravenous contrast. COMPARISON:  None. FINDINGS: Brain: Ventricles and sulci are normal in size and configuration. There is no intracranial mass, hemorrhage, extra-axial fluid collection, or midline shift. Brain parenchyma appears unremarkable. No evident acute infarct. Vascular: No hyperdense vessel.  No evident vascular calcification. Skull: Bony calvarium appears intact. Sinuses/Orbits: Visualized paranasal sinuses are clear. Orbits appear symmetric bilaterally. Other: Mastoid air cells are clear. IMPRESSION: Study within normal limits. Electronically Signed   By: Lowella Grip III M.D.   On: 06/09/2020 14:41   DG Chest Port 1 View  Result Date: 06/09/2020 CLINICAL DATA:  Dyspnea, dizziness, polydipsia EXAM: PORTABLE CHEST 1 VIEW COMPARISON:  None. FINDINGS: The heart size and mediastinal contours are within normal limits. Both lungs are clear. The visualized skeletal structures are unremarkable. IMPRESSION: No active disease. Electronically Signed   By: Randa Ngo M.D.   On: 06/09/2020 15:12        Scheduled Meds: . enoxaparin (LOVENOX) injection  40 mg Subcutaneous Q24H  . insulin aspart  0-15 Units Subcutaneous TID WC  . insulin aspart  0-5 Units Subcutaneous QHS  . insulin aspart  5 Units Subcutaneous TID WC  . insulin glargine  20 Units Subcutaneous Daily  . insulin starter kit- pen needles  1 kit Other Once  . living well with diabetes book   Does not apply Once   Continuous Infusions: . dextrose 115 mL/hr at 06/10/20 0600  . insulin 7.5 Units/hr (06/10/20 1016)     LOS: 1 day    Time spent: 39 minutes spent on chart review, discussion with nursing staff, consultants, updating family and interview/physical exam; more than 50% of that time was spent in counseling and/or coordination of care.    Marcey Persad J [place] DO [facility] Available via Epic secure chat 7am-7pm After these hours, please refer to coverage provider listed on  [email or web address] 06/10/2020, 10:25 AM

## 2020-06-10 NOTE — Plan of Care (Signed)

## 2020-06-10 NOTE — Plan of Care (Addendum)
  RD consulted for nutrition education regarding diabetes. Newly dx DM  Lab Results  Component Value Date   HGBA1C >15.5 (H) 06/09/2020    RD provided "Carbohydrate Counting for People with Diabetes" handout from the Academy of Nutrition and Dietetics. Discussed different food groups and their effects on blood sugar, emphasizing carbohydrate-containing foods. Provided list of carbohydrates and recommended serving sizes of common foods.  Discussed importance of controlled and consistent carbohydrate intake throughout the day. Pt reports she typically eats 3 meals a day; encouraged pt to continue to eat regularly scheduled meals throughout the day (pt should not go more than 4 to 5 hours without eating once waking up). Provided examples of ways to balance meals/snacks and encouraged intake of high-fiber, whole grain complex carbohydrates. Pt reports her sister got "rid of" her yogurt, fruit, etc. Explained that this foods play an important role in her diet and she can continue to eat these types of foods;, these foods just need to be counted as carb servings;  explained that these foods do contain carbohydrates but also contain important vitamins and minerals and fiber. Pt currently drinks water, cranberry and apple juice and soda. Discussed importance of limiting/eliminating sugar sweetened beverages in diet and discussed appropriate drink alternatives. Teach back method used.    Pt currently on Heart Healthy/Carb Modified diet; no recorded po intake but good appetite and hungry waiting on meal tray on visit today. Pt reports poor appetite with wt loss PTA but appetite now improved. Wt loss likely related to uncontrolled, undiagnosed DM with poor appetite. BMI 38  Pt appears somewhat confused, distracted on visit today, moving all around. Noted admission with encephalopathy. Plan to follow-up and provide further diet ed this admission as able. However regardless, patient will likely need further diet  education post discharge. RD to place outpatient referral for DM education (new dx). Please reconsult RD as needed  Romelle Starcher MS, RDN, LDN, CNSC Registered Dietitian III Clinical Nutrition RD Pager and On-Call Pager Number Located in [place]

## 2020-06-10 NOTE — Progress Notes (Addendum)
Inpatient Diabetes Program Recommendations  AACE/ADA: New Consensus Statement on Inpatient Glycemic Control (2015)  Target Ranges:  Prepandial:   less than 140 mg/dL      Peak postprandial:   less than 180 mg/dL (1-2 hours)      Critically ill patients:  140 - 180 mg/dL   Lab Results  Component Value Date   GLUCAP 234 (H) 06/10/2020    Review of Glycemic Control Results for Whitney Baker, Whitney Baker (MRN [id number]) as of 06/10/2020 09:56  Ref. Range 06/09/2020 12:40  CO2 Latest Ref Range: 22 - 32 mmol/L 11 (L)  Results for Whitney Baker, Whitney Baker (MRN [id number]) as of 06/10/2020 09:56  Ref. Range 06/09/2020 12:40  Glucose Latest Ref Range: 70 - 99 mg/dL 1,434 [facility])  Results for Whitney Baker, Whitney Baker (MRN [id number]) as of 06/10/2020 09:56  Ref. Range 06/09/2020 12:40  Anion gap Latest Ref Range: 5 - 15  32 (H)  Results for Whitney Baker, Whitney Baker (MRN [id number]) as of 06/10/2020 09:56  Ref. Range 06/09/2020 13:21  Beta-Hydroxybutyric Acid Latest Ref Range: 0.05 - 0.27 mmol/L >8.00 (H)   Diabetes history: None Current DM meds: Prednisone 40 mg daily Current orders for Inpatient glycemic control:  IV insulin  Referral for DM management.  Patient presents from work with dizziness/AMS/DKA.  New DM; A1C pending.  Ordered Living Well with Diabetes booklet, insulin starter kit and placed consult for RD for diet education.  Will speak with patient today.    Addendum@ 1250 Spoke with RN; patient is still a bit altered.  Spoke with patient briefly at bedside.  No history or family history of diabetes.  She has not been taking the prednisone as listed above.  She was at work and EMS was called because she became weak and confused.  She admits to losing 20-30 lbs over the last couple of weeks.  Admits to polyuria, polydipsia and generally feeling unwell.  She has insurance; unsure how insurance will cover insulins.  Will ask TOC to run benefit check on Lantus and Novolog.  She has not been to a PCP in many  years.  She has a new appointment which she recently made with [facility] on March 29th.  Sister is a Biomedical scientist in the [employer] and would like to be present for DM teaching.  Will return tomorrow and begin DM education.  LWWD booklet and insulin starter kit is at bedside.    Transitioning off IV insulin.  Lantus 20 units daily, Novolog 0-15 units TID & 0-5 units QHS, Novolog 5 units TID with meals--Agree.    Will continue to follow while inpatient.  Thank you, Reche Dixon, RN, BSN Diabetes Coordinator Inpatient Diabetes Program [phone] (team pager from 8a-5p)   Will continue to follow while inpatient.  Thank you, Reche Dixon, RN, BSN Diabetes Coordinator Inpatient Diabetes Program [phone] (team pager from 8a-5p)

## 2020-06-10 NOTE — Evaluation (Signed)
Physical Therapy Evaluation Patient Details Name: Whitney Baker MRN: [id number] DOB: 06/27/1975 Today's Date: 06/10/2020   History of Present Illness  Pt is a 45 y/o female presenting 3/23 with complaints of dizziness while at work and AMS on arrival.  Work up arrived at new diagnosis of DM.  PMHx sciatica, hidradenitis  Clinical Impression  Pt is at or close to baseline functioning and should be safe at home . There are no further acute PT needs.  Will sign off at this time.     Follow Up Recommendations No PT follow up    Equipment Recommendations  None recommended by PT    Recommendations for Other Services       Precautions / Restrictions        Mobility  Bed Mobility Overal bed mobility: Needs Assistance Bed Mobility: Supine to Sit     Supine to sit: Independent          Transfers Overall transfer level: Independent                  Ambulation/Gait Ambulation/Gait assistance: Independent Gait Distance (Feet): 200 Feet Assistive device: None Gait Pattern/deviations: WFL(Within Functional Limits)   Gait velocity interpretation: 1.31 - 2.62 ft/sec, indicative of limited community ambulator    Stairs Stairs: Yes Stairs assistance: Modified independent (Device/Increase time) Stair Management: One rail Left;Alternating pattern;Forwards Number of Stairs: 4    Wheelchair Mobility    Modified Rankin (Stroke Patients Only)       Balance Overall balance assessment: Needs assistance;Independent Sitting-balance support: No upper extremity supported Sitting balance-Leahy Scale: Normal       Standing balance-Leahy Scale: Good                               Pertinent Vitals/Pain Pain Assessment: No/denies pain    Home Living Family/patient expects to be discharged to:: Private residence Living Arrangements: Alone   Type of Home: House Home Access: Stairs to enter Entrance Stairs-Rails: Doctor, general practice  of Steps: flight Home Layout: One level        Prior Function Level of Independence: Independent               Hand Dominance        Extremity/Trunk Assessment   Upper Extremity Assessment Upper Extremity Assessment: Overall WFL for tasks assessed    Lower Extremity Assessment Lower Extremity Assessment: Overall WFL for tasks assessed       Communication   Communication: No difficulties  Cognition Arousal/Alertness: Awake/alert Behavior During Therapy: WFL for tasks assessed/performed Overall Cognitive Status: Within Functional Limits for tasks assessed                                 General Comments: Asking appropriate question during DM discussion with RN      General Comments General comments (skin integrity, edema, etc.): vss    Exercises     Assessment/Plan    PT Assessment Patent does not need any further PT services  PT Problem List         PT Treatment Interventions      PT Goals (Current goals can be found in the Care Plan section)  Acute Rehab PT Goals Patient Stated Goal: Educated on diabeties, Independent PT Goal Formulation: All assessment and education complete, DC therapy    Frequency     Barriers to discharge  Co-evaluation               AM-PAC PT "6 Clicks" Mobility  Outcome Measure Help needed turning from your back to your side while in a flat bed without using bedrails?: None Help needed moving from lying on your back to sitting on the side of a flat bed without using bedrails?: None Help needed moving to and from a bed to a chair (including a wheelchair)?: None Help needed standing up from a chair using your arms (e.g., wheelchair or bedside chair)?: None Help needed to walk in hospital room?: None Help needed climbing 3-5 steps with a railing? : None 6 Click Score: 24    End of Session   Activity Tolerance: Patient tolerated treatment well Patient left: in bed;with call bell/phone within  reach Nurse Communication: Mobility status      Time: 8366-2947 PT Time Calculation (min) (ACUTE ONLY): 17 min   Charges:   PT Evaluation $PT Eval Low Complexity: 1 Low          06/10/2020  Jacinto Halim., PT Acute Rehabilitation Services [phone]  (pager) [phone]  (office)  Whitney Baker 06/10/2020, 5:38 PM

## 2020-06-10 NOTE — TOC Benefit Eligibility Note (Signed)
Transition of Care [facility]) Benefit Eligibility Note    Patient Details  Name: ARROW EMMERICH MRN: [id number] Date of Birth: 1976/01/13   Medication/Dose: LANTUS 20 UNITS  DAILY  : NOT COVER   P/A-YES #  [phone]  Covered?: No     Prescription Coverage Preferred Pharmacy: [facility], [address]  Spoke with Person/Company/Phone Number:: [employer]  @  [employer] # [phone]     Prior Approval: Yes [phone])     Additional Notes: NOVOLOG  0-15 UNITS : NOT COVER     and    NOVOLOG 5 UNITS : NOT COVER   P/A_ YES  # [phone]    Mardene Sayer Phone Number: 06/10/2020, 5:28 PM

## 2020-06-11 LAB — CBC
HCT: 44.9 % (ref 36.0–46.0)
Hemoglobin: 15.3 g/dL — ABNORMAL HIGH (ref 12.0–15.0)
MCH: 27.9 pg (ref 26.0–34.0)
MCHC: 34.1 g/dL (ref 30.0–36.0)
MCV: 81.8 fL (ref 80.0–100.0)
Platelets: 163 10*3/uL (ref 150–400)
RBC: 5.49 MIL/uL — ABNORMAL HIGH (ref 3.87–5.11)
RDW: 13.2 % (ref 11.5–15.5)
WBC: 13.2 10*3/uL — ABNORMAL HIGH (ref 4.0–10.5)
nRBC: 0 % (ref 0.0–0.2)

## 2020-06-11 LAB — BASIC METABOLIC PANEL
Anion gap: 11 (ref 5–15)
BUN: 20 mg/dL (ref 6–20)
CO2: 25 mmol/L (ref 22–32)
Calcium: 9.3 mg/dL (ref 8.9–10.3)
Chloride: 109 mmol/L (ref 98–111)
Creatinine, Ser: 1.1 mg/dL — ABNORMAL HIGH (ref 0.44–1.00)
GFR, Estimated: >60 mL/min (ref >60)
Glucose, Bld: 321 mg/dL — ABNORMAL HIGH (ref 70–99)
Potassium: 3.4 mmol/L — ABNORMAL LOW (ref 3.5–5.1)
Sodium: 145 mmol/L (ref 135–145)

## 2020-06-11 LAB — GLUCOSE, CAPILLARY
Glucose-Capillary: 323 mg/dL — ABNORMAL HIGH (ref 70–99)
Glucose-Capillary: 325 mg/dL — ABNORMAL HIGH (ref 70–99)
Glucose-Capillary: 230 mg/dL — ABNORMAL HIGH (ref 70–99)
Glucose-Capillary: 107 mg/dL — ABNORMAL HIGH (ref 70–99)

## 2020-06-11 LAB — MAGNESIUM: Magnesium: 2.6 mg/dL — ABNORMAL HIGH (ref 1.7–2.4)

## 2020-06-11 MED ORDER — POTASSIUM CHLORIDE CRYS ER 20 MEQ PO TBCR
40.0000 meq | EXTENDED_RELEASE_TABLET | Freq: Once | ORAL | Status: AC
  Administered 2020-06-11: 40 meq via ORAL
  Filled 2020-06-11: qty 2

## 2020-06-11 MED ORDER — INSULIN ASPART 100 UNIT/ML ~~LOC~~ SOLN
10.0000 [IU] | Freq: Three times a day (TID) | SUBCUTANEOUS | Status: DC
  Administered 2020-06-11 – 2020-06-12 (×5): 10 [IU] via SUBCUTANEOUS

## 2020-06-11 MED ORDER — INSULIN GLARGINE 100 UNIT/ML ~~LOC~~ SOLN
35.0000 [IU] | Freq: Every day | SUBCUTANEOUS | Status: DC
  Administered 2020-06-11 – 2020-06-12 (×2): 35 [IU] via SUBCUTANEOUS
  Filled 2020-06-11 (×3): qty 0.35

## 2020-06-11 NOTE — Progress Notes (Signed)
PROGRESS NOTE    Whitney Baker  PJA:[id number] DOB: 05/28/1975 DOA: 06/09/2020 PCP: Patient, No Pcp Per    Brief Narrative:  Whitney Baker is a 45 year old female with past medical history significant for sciatica, hidradenitis, obesity who presented to the ED with complaints of dizziness while at work.  History is limited due to patient's confusion on presentation.  Patient reports recent issues with sciatica on the left side, recently prescribed prednisone and prior to ED presentation took a Flexeril.  Patient reports increased thirst, urinary frequency, blurred vision, weight loss.  Patient's mother confirms that she has lost a significant amount of weight from 295 pounds down to 230 pounds over the last month.  Patient currently not seen regularly by PCP.  Upon admission to the emergency department patient was seen to be afebrile, pulse 10 7-111, respirations 16-24, blood pressure 115/97-130 3/110, and O2 saturation maintained on room air. CT scan of the brain without contrast was negative.  Labs significant for WBC 15.9, hemoglobin 17.2 sodium 136, chloride 93, CO2 11, BUN 37, creatinine 2.37, glucose 1434, calcium 10.4, anion gap 32, and beta hydroxybutyrate greater than 8.  Urinalysis positive for glucose and ketones.  Patient was given bolus of 3.5 L of IV fluids, 20 mEq of potassium, and started on insulin drip.   Assessment & Plan:   Principal Problem:   DKA (diabetic ketoacidosis) (HCC) Active Problems:   Obesity (BMI 30-39.9)   Leukocytosis   AKI (acute kidney injury) (HCC)   Acute metabolic encephalopathy   Polycythemia   Sciatic neuropathy, left   Acute metabolic encephalopathy: POA High anion gap metabolic acidosis Patient appears to be somewhat confused.  Suspect multifactorial in the setting of hyperglycemia, acute renal failure, CBD pills, and/or Flexeril.  UDS positive for THC. --Continue treatment as below  DKA type II: Acute.   Patient presented with  complaints dizziness with polyuria and probably polydipsia. Glucose elevated up to 1434, CO2 11, beta hydroxybutyrate >8, and anion gap 32. Urinalysis was positive for glucose and ketones.  Venous pH was 7.295.  She has no prior history of diabetes. --Diabetic educator following, appreciate assistance --Hemoglobin A1c: >15.5 --Increase Lantus to 35 units [place] daily --Increase Novolog to 10u TIDAC --Moderate SSI for further coverage --CBGs before every meal/at bedtime --Nutrition consult for diet education  Leukocytosis:  Improving Wbc 15.9, but suspect secondary to above.  Urine did not show any clear signs of infection and lungs sound clear on physical exam. --WBC 15.5>15.4>13.2 --CBC in am  Acute kidney injury:  On admission patient presents with creatinine 2.37 with BUN 37.  Suspect secondary to dehydration given uncontrolled diabetes. --Cr 3.37>1.16>1.12 --BMP in a.m.  Polycythemia: Acute.   On admission hemoglobin elevated at 17.2.  Suspect secondary to hemoconcentration related with dehydration. --Hemoglobin 17.2> 15.5>15.3 --Repeat CBC in a.m.  Sciatica on left side:  Patient had recently had complaints of sciatic nerve pain down the left.  Recently prescribed Flexeril and prednisone. --PT with no recommendations  Obesity:  BMI 35.12 kg/m2.  Discussed with patient needs for aggressive lifestyle changes/weight loss as this complicates all facets of care.  Outpatient follow-up with PCP.  May benefit from bariatric evaluation outpatient.   DVT prophylaxis: Lovenox   Code Status: Full Code Family Communication: Updated patient's sister present at bedside  Disposition Plan:  Level of care: Progressive Status is: Inpatient  Remains inpatient appropriate because:Altered mental status, Ongoing diagnostic testing needed not appropriate for outpatient work up, Unsafe d/c plan, IV treatments appropriate  due to intensity of illness or inability to take PO and Inpatient level of  care appropriate due to severity of illness   Dispo: The patient is from: Home              Anticipated d/c is to: Home              Patient currently is not medically stable to d/c.   Difficult to place patient No   Consultants:   None  Procedures:   None  Antimicrobials:   None   Subjective: Patient seen and examined bedside, resting comfortably.  Confusion now resolved.  Patient sister present at bedside.  Answered multiple questions.  Blood sugar remains elevated at 321 this morning, will need further inpatient titration of insulin regimen.  Patient also reports her blurred vision and leg pain now resolved given better control of her newly diagnosed diabetes. Denies headache, no chest pain, palpitations, no shortness of breath, no abdominal pain.  No acute events overnight per nursing staff.  Objective: Vitals:   06/10/20 1600 06/10/20 1937 06/10/20 2339 06/11/20 0300  BP:  (!) 123/99 (!) 118/92 115/83  Pulse:  100 89 90  Resp:  20 19 17   Temp: 98.4 F (36.9 C) 98 F (36.7 C) 98.1 F (36.7 C) 98.3 F (36.8 C)  TempSrc: Oral Oral Oral Oral  SpO2:  98% 99% 99%  Weight:      Height:        Intake/Output Summary (Last 24 hours) at 06/11/2020 1033 Last data filed at 06/11/2020 0800 Gross per 24 hour  Intake 540.39 ml  Output 0 ml  Net 540.39 ml   Filed Weights   06/09/20 1433 06/09/20 2311  Weight: 104.8 kg 107.8 kg    Examination:  General exam: Appears calm and comfortable, obese Respiratory system: Clear to auscultation. Respiratory effort normal.  On room air Cardiovascular system: S1 & S2 heard, RRR. No JVD, murmurs, rubs, gallops or clicks. No pedal edema. Gastrointestinal system: Abdomen is nondistended, soft and nontender. No organomegaly or masses felt. Normal bowel sounds heard. Central nervous system: Alert and oriented x3. No focal neurological deficits. Extremities: Symmetric 5 x 5 power. Skin: No rashes, lesions or ulcers Psychiatry: Judgement  and insight appear poor. Mood & affect appropriate.     Data Reviewed: I have personally reviewed following labs and imaging studies  CBC: Recent Labs  Lab 06/09/20 1240 06/09/20 1325 06/09/20 1330 06/10/20 0344 06/11/20 0109  WBC 15.9*  --   --  15.4* 13.2*  HGB 17.2* 18.7* 17.7* 15.5* 15.3*  HCT 55.4* 55.0* 52.0* 47.4* 44.9  MCV 88.8  --   --  82.7 81.8  PLT 321  --   --  217 163   Basic Metabolic Panel: Recent Labs  Lab 06/09/20 1701 06/09/20 2326 06/10/20 0344 06/10/20 0755 06/11/20 0109  NA 151* 152* 153* 153* 145  K 3.8 4.1 3.4* 3.1* 3.4*  CL 114* 115* 118* 115* 109  CO2 19* 26 25 27 25   GLUCOSE 480* 343* 254* 245* 321*  BUN 23* 22* 24* 22* 20  CREATININE 1.65* 1.36* 1.16* 1.16* 1.10*  CALCIUM 9.7 9.7 9.5 9.6 9.3  MG  --   --   --   --  2.6*   GFR: Estimated Creatinine Clearance: 81.9 mL/min (A) (by C-G formula based on SCr of 1.1 mg/dL (H)). Liver Function Tests: Recent Labs  Lab 06/10/20 0344  AST 17  ALT 19  ALKPHOS 95  BILITOT 0.6  PROT  6.0*  ALBUMIN 3.1*   No results for input(s): LIPASE, AMYLASE in the last 168 hours. No results for input(s): AMMONIA in the last 168 hours. Coagulation Profile: No results for input(s): INR, PROTIME in the last 168 hours. Cardiac Enzymes: No results for input(s): CKTOTAL, CKMB, CKMBINDEX, TROPONINI in the last 168 hours. BNP (last 3 results) No results for input(s): PROBNP in the last 8760 hours. HbA1C: Recent Labs    06/09/20 1701  HGBA1C >15.5*   CBG: Recent Labs  Lab 06/10/20 1300 06/10/20 1523 06/10/20 2124 06/10/20 2338 06/11/20 0613  GLUCAP 313* 254* 409* 313* 323*   Lipid Profile: No results for input(s): CHOL, HDL, LDLCALC, TRIG, CHOLHDL, LDLDIRECT in the last 72 hours. Thyroid Function Tests: No results for input(s): TSH, T4TOTAL, FREET4, T3FREE, THYROIDAB in the last 72 hours. Anemia Panel: No results for input(s): VITAMINB12, FOLATE, FERRITIN, TIBC, IRON, RETICCTPCT in the last 72  hours. Sepsis Labs: No results for input(s): PROCALCITON, LATICACIDVEN in the last 168 hours.  Recent Results (from the past 240 hour(s))  MRSA PCR Screening     Status: None   Collection Time: 06/09/20  2:50 AM   Specimen: Nasopharyngeal  Result Value Ref Range Status   MRSA by PCR NEGATIVE NEGATIVE Final    Comment:        The GeneXpert MRSA Assay (FDA approved for NASAL specimens only), is one component of a comprehensive MRSA colonization surveillance program. It is not intended to diagnose MRSA infection nor to guide or monitor treatment for MRSA infections. Performed at [facility], [address] [place], [place] [address]   Resp Panel by RT-PCR (Flu A&B, Covid) Nasopharyngeal Swab     Status: None   Collection Time: 06/09/20  1:52 PM   Specimen: Nasopharyngeal Swab; Nasopharyngeal(NP) swabs in vial transport medium  Result Value Ref Range Status   SARS Coronavirus 2 by RT PCR NEGATIVE NEGATIVE Final    Comment: (NOTE) SARS-CoV-2 target nucleic acids are NOT DETECTED.  The SARS-CoV-2 RNA is generally detectable in upper respiratory specimens during the acute phase of infection. The lowest concentration of SARS-CoV-2 viral copies this assay can detect is 138 copies/mL. A negative result does not preclude SARS-Cov-2 infection and should not be used as the sole basis for treatment or other patient management decisions. A negative result may occur with  improper specimen collection/handling, submission of specimen other than nasopharyngeal swab, presence of viral mutation(s) within the areas targeted by this assay, and inadequate number of viral copies(<138 copies/mL). A negative result must be combined with clinical observations, patient history, and epidemiological information. The expected result is Negative.  Fact Sheet for Patients:  BloggerCourse.com  Fact Sheet for Healthcare Providers:   SeriousBroker.it  This test is no t yet approved or cleared by the [place] FDA and  has been authorized for detection and/or diagnosis of SARS-CoV-2 by FDA under an Emergency Use Authorization (EUA). This EUA will remain  in effect (meaning this test can be used) for the duration of the COVID-19 declaration under Section 564(b)(1) of the Act, 21 U.S.C.section 360bbb-3(b)(1), unless the authorization is terminated  or revoked sooner.       Influenza A by PCR NEGATIVE NEGATIVE Final   Influenza B by PCR NEGATIVE NEGATIVE Final    Comment: (NOTE) The Xpert Xpress SARS-CoV-2/FLU/RSV plus assay is intended as an aid in the diagnosis of influenza from Nasopharyngeal swab specimens and should not be used as a sole basis for treatment. Nasal washings and aspirates  are unacceptable for Xpert Xpress SARS-CoV-2/FLU/RSV testing.  Fact Sheet for Patients: BloggerCourse.com  Fact Sheet for Healthcare Providers: SeriousBroker.it  This test is not yet approved or cleared by the [place] FDA and has been authorized for detection and/or diagnosis of SARS-CoV-2 by FDA under an Emergency Use Authorization (EUA). This EUA will remain in effect (meaning this test can be used) for the duration of the COVID-19 declaration under Section 564(b)(1) of the Act, 21 U.S.C. section 360bbb-3(b)(1), unless the authorization is terminated or revoked.  Performed at [facility], [address], [place], [place] [address]          Radiology Studies: CT Head Wo Contrast  Result Date: 06/09/2020 CLINICAL DATA:  Dizziness EXAM: CT HEAD WITHOUT CONTRAST TECHNIQUE: Contiguous axial images were obtained from the base of the skull through the vertex without intravenous contrast. COMPARISON:  None. FINDINGS: Brain: Ventricles and sulci are normal in size and configuration. There is no intracranial mass, hemorrhage,  extra-axial fluid collection, or midline shift. Brain parenchyma appears unremarkable. No evident acute infarct. Vascular: No hyperdense vessel.  No evident vascular calcification. Skull: Bony calvarium appears intact. Sinuses/Orbits: Visualized paranasal sinuses are clear. Orbits appear symmetric bilaterally. Other: Mastoid air cells are clear. IMPRESSION: Study within normal limits. Electronically Signed   By: Bretta Bang III M.D.   On: 06/09/2020 14:41   DG Chest Port 1 View  Result Date: 06/09/2020 CLINICAL DATA:  Dyspnea, dizziness, polydipsia EXAM: PORTABLE CHEST 1 VIEW COMPARISON:  None. FINDINGS: The heart size and mediastinal contours are within normal limits. Both lungs are clear. The visualized skeletal structures are unremarkable. IMPRESSION: No active disease. Electronically Signed   By: Sharlet Salina M.D.   On: 06/09/2020 15:12        Scheduled Meds: . enoxaparin (LOVENOX) injection  40 mg Subcutaneous Q24H  . insulin aspart  0-15 Units Subcutaneous TID WC  . insulin aspart  0-5 Units Subcutaneous QHS  . insulin aspart  10 Units Subcutaneous TID WC  . insulin glargine  35 Units Subcutaneous Daily   Continuous Infusions:    LOS: 2 days    Time spent: 37 minutes spent on chart review, discussion with nursing staff, consultants, updating family and interview/physical exam; more than 50% of that time was spent in counseling and/or coordination of care.    Alvira Philips [place], DO [facility] Available via Epic secure chat 7am-7pm After these hours, please refer to coverage provider listed on [email or web address] 06/11/2020, 10:33 AM

## 2020-06-11 NOTE — Progress Notes (Addendum)
Inpatient Diabetes Program Recommendations  AACE/ADA: New Consensus Statement on Inpatient Glycemic Control (2015)  Target Ranges:  Prepandial:   less than 140 mg/dL      Peak postprandial:   less than 180 mg/dL (1-2 hours)      Critically ill patients:  140 - 180 mg/dL   Lab Results  Component Value Date   ZOXWRU 045 (H) 06/11/2020   HGBA1C >15.5 (H) 06/09/2020    Review of Glycemic Control  Diabetes history:  New DM Outpatient Diabetes medications:  NA Current orders for Inpatient glycemic control:  Lantus 35 units daily Novolog 0-15 units TID & 0-5 units QHS  Spoke with [employer] representative.  Levemir and Humalog are both covered by patient's insurance.   Levemir- $87.97 for retail 30 day supply/$247.93 for 90 day supply mail order Humalog-$100.95 for 30 day supply/ $284.54 for 90 day supply mail order  Note: Spoke with patient and sister, Lenda Kelp about new diagnosis. Discussed A1C results with them and explained what an A1C is, basic pathophysiology of DM Type 2, basic home care, basic diabetes diet nutrition principles, importance of checking CBGs and maintaining good CBG control to prevent long-term and short-term complications. Reviewed signs and symptoms of hyperglycemia and hypoglycemia and how to treat hypoglycemia at home. Also reviewed blood sugar goals at home.  RNs to provide ongoing basic DM education at bedside with this patient. Have ordered educational booklet, insulin starter kit, and DM videos. Have also placed RD consult for DM diet education for this patient.  Educated patient on insulin pen use at home. Reviewed contents of insulin flexpen starter kit. Reviewed all steps of insulin pen including attachment of needle, 2-unit air shot, dialing up dose, giving injection, removing needle, disposal of sharps, storage of unused insulin, disposal of insulin etc. Patient able to provide successful return demonstration. Also reviewed troubleshooting with insulin  pen. MD to give patient Rxs for insulin pens and insulin pen needles.   Patient states she can afford the insulins as she has an HSA.  Explained if patient starts having trouble affording insulins down the road she can use 70/30 which would be more affordable.  Encouraged her to contact insulin manufacturers about medication assistance programs.  She has a PCP appointment with [facility] on March 29th.  She is aware to keep this appointment and continue to follow up every 3 months.    Educated patient on CHO's and goal CHO's along with importance of serving sizes.  She is aware to eliminate beverages with sugar.  We discussed hypoglycemia, signs, symptoms and treatments.  She should check her CBG 4x a day, ac/hs.  She should also check her blood sugar if she has symptoms of hypoglycemia.  If her blood sugar is less than 100 mg/dL at bedtime she should have a 15 gram carb snack.    Discussed importance of exercise on glucose control.  She is very interested in all information provided.  She states she is determined to get her blood sugar under control and off of insulin. She has the LWWD booklet at bedside.  We reviewed this as well.  Gave her a link to a You Tube video on insulin pen administration.    MD please order at discharge: Glucometer-[id number] Humalog [place]  [facility] so insulins can be brought to bedside prior to discharge.     Will continue to follow while inpatient.  Thank you, Reche Dixon, RN, BSN Diabetes Coordinator Inpatient Diabetes  Program [phone] (team pager from 8a-5p)

## 2020-06-12 ENCOUNTER — Encounter (HOSPITAL_COMMUNITY): Payer: Self-pay | Admitting: Internal Medicine

## 2020-06-12 DIAGNOSIS — G5702 Lesion of sciatic nerve, left lower limb: Secondary | ICD-10-CM

## 2020-06-12 DIAGNOSIS — E1110 Type 2 diabetes mellitus with ketoacidosis without coma: Principal | ICD-10-CM

## 2020-06-12 DIAGNOSIS — N179 Acute kidney failure, unspecified: Secondary | ICD-10-CM

## 2020-06-12 DIAGNOSIS — E1301 Other specified diabetes mellitus with hyperosmolarity with coma: Secondary | ICD-10-CM

## 2020-06-12 DIAGNOSIS — G9341 Metabolic encephalopathy: Secondary | ICD-10-CM

## 2020-06-12 DIAGNOSIS — D72829 Elevated white blood cell count, unspecified: Secondary | ICD-10-CM

## 2020-06-12 DIAGNOSIS — D72825 Bandemia: Secondary | ICD-10-CM

## 2020-06-12 DIAGNOSIS — E1111 Type 2 diabetes mellitus with ketoacidosis with coma: Secondary | ICD-10-CM

## 2020-06-12 LAB — CBC
HCT: 41.6 % (ref 36.0–46.0)
Hemoglobin: 13.4 g/dL (ref 12.0–15.0)
MCH: 27.2 pg (ref 26.0–34.0)
MCHC: 32.2 g/dL (ref 30.0–36.0)
MCV: 84.4 fL (ref 80.0–100.0)
Platelets: 139 10*3/uL — ABNORMAL LOW (ref 150–400)
RBC: 4.93 MIL/uL (ref 3.87–5.11)
RDW: 13.2 % (ref 11.5–15.5)
WBC: 12.9 10*3/uL — ABNORMAL HIGH (ref 4.0–10.5)
nRBC: 0 % (ref 0.0–0.2)

## 2020-06-12 LAB — LIPID PANEL
Cholesterol: 225 mg/dL — ABNORMAL HIGH (ref 0–200)
HDL: 30 mg/dL — ABNORMAL LOW (ref >40)
LDL Cholesterol: 150 mg/dL — ABNORMAL HIGH (ref 0–99)
Total CHOL/HDL Ratio: 7.5 RATIO
Triglycerides: 226 mg/dL — ABNORMAL HIGH (ref <150)
VLDL: 45 mg/dL — ABNORMAL HIGH (ref 0–40)

## 2020-06-12 LAB — BASIC METABOLIC PANEL
Anion gap: 8 (ref 5–15)
BUN: 15 mg/dL (ref 6–20)
CO2: 29 mmol/L (ref 22–32)
Calcium: 8.8 mg/dL — ABNORMAL LOW (ref 8.9–10.3)
Chloride: 106 mmol/L (ref 98–111)
Creatinine, Ser: 1 mg/dL (ref 0.44–1.00)
GFR, Estimated: >60 mL/min (ref >60)
Glucose, Bld: 91 mg/dL (ref 70–99)
Potassium: 3.1 mmol/L — ABNORMAL LOW (ref 3.5–5.1)
Sodium: 143 mmol/L (ref 135–145)

## 2020-06-12 LAB — GLUCOSE, CAPILLARY
Glucose-Capillary: 223 mg/dL — ABNORMAL HIGH (ref 70–99)
Glucose-Capillary: 262 mg/dL — ABNORMAL HIGH (ref 70–99)

## 2020-06-12 LAB — MAGNESIUM: Magnesium: 2.4 mg/dL (ref 1.7–2.4)

## 2020-06-12 MED ORDER — ATORVASTATIN CALCIUM 10 MG PO TABS
20.0000 mg | ORAL_TABLET | Freq: Every day | ORAL | Status: DC
  Administered 2020-06-12: 20 mg via ORAL
  Filled 2020-06-12: qty 2

## 2020-06-12 MED ORDER — ATORVASTATIN CALCIUM 20 MG PO TABS: 20.0000 mg | ORAL_TABLET | Freq: Every day | ORAL | 0 refills | Status: DC

## 2020-06-12 MED ORDER — POTASSIUM CHLORIDE CRYS ER 20 MEQ PO TBCR
40.0000 meq | EXTENDED_RELEASE_TABLET | ORAL | Status: AC
  Administered 2020-06-12 (×2): 40 meq via ORAL
  Filled 2020-06-12 (×2): qty 2

## 2020-06-12 MED ORDER — BLOOD GLUCOSE METER KIT: PACK | 0 refills | Status: AC

## 2020-06-12 MED ORDER — PEN NEEDLES 31G X 6 MM MISC: 2 refills | Status: DC

## 2020-06-12 MED ORDER — LEVEMIR FLEXTOUCH 100 UNIT/ML ~~LOC~~ SOPN: 35.0000 [IU] | PEN_INJECTOR | Freq: Two times a day (BID) | SUBCUTANEOUS | 2 refills | Status: DC

## 2020-06-12 MED ORDER — INSULIN LISPRO (1 UNIT DIAL) 100 UNIT/ML (KWIKPEN): 15.0000 [IU] | PEN_INJECTOR | Freq: Three times a day (TID) | SUBCUTANEOUS | 2 refills | Status: DC

## 2020-06-12 MED ORDER — METFORMIN HCL 500 MG PO TABS: 500.0000 mg | ORAL_TABLET | Freq: Two times a day (BID) | ORAL | 1 refills | Status: DC

## 2020-06-12 NOTE — Progress Notes (Signed)
Inpatient Diabetes Program Recommendations  AACE/ADA: New Consensus Statement on Inpatient Glycemic Control (2015)  Target Ranges:  Prepandial:   less than 140 mg/dL      Peak postprandial:   less than 180 mg/dL (1-2 hours)      Critically ill patients:  140 - 180 mg/dL   Lab Results  Component Value Date   GLUCAP 223 (H) 06/12/2020   HGBA1C >15.5 (H) 06/09/2020    Review of Glycemic Control Results for Whitney Baker, Whitney Baker (MRN [id number]) as of 06/12/2020 09:08  Ref. Range 06/11/2020 06:13 06/11/2020 11:24 06/11/2020 16:11 06/11/2020 21:17 06/12/2020 06:34  Glucose-Capillary Latest Ref Range: 70 - 99 mg/dL 188 (H) 416 (H) 606 (H) 107 (H) 223 (H)   Diabetes history:  New DM Outpatient Diabetes medications:  NA Current orders for Inpatient glycemic control:  Lantus 35 units daily Novolog 0-15 units TID & 0-5 units QHS Novolog 10 units tid meal coverage  Note fasting glucose 223 this am. Consider the following: -  Increase Lantus to 42 units    MD please order at discharge: [id number] Humalog [id number] Levemir- [id number]  Please send prescriptions to [facility] pharmacy so insulins can be brought to bedside prior to discharge.    Will continue to follow while inpatient.  Thank you, Christena Deem RN, MSN, BC-ADM Inpatient Diabetes Coordinator Team Pager [phone] (8a-5p)

## 2020-06-12 NOTE — Discharge Summary (Signed)
Physician Discharge Summary  Whitney Baker:[id number] DOB: [birth year] DOA: 06/09/2020  PCP: Patient, No Pcp Per  Admit date: 06/09/2020 Discharge date: 06/12/2020  Admitted From: Home Disposition: Home  Recommendations for Outpatient Follow-up:  1. Follow ups as below. 2. Please obtain CBC/BMP/Mag at follow up 3. Please follow up on the following pending results: None  Home Health: None required Equipment/Devices: None required  Discharge Condition: Stable CODE STATUS: Full code   Follow-up Information    [facility] Follow up.   Why: she has a follow up apt  follow up Contact information: [address] [place] [facility] Course: 45 year old F with PMH of sciatica, hydradenitis and obesity presenting with altered mental status, increased stress, urinary frequency, blurry vision and unintentional weight loss, and found to be in severe DKA/HHS with elevated glucose to 1434, bicarb of 11, anion gap of 32 and BHA> 8.0.  A1c > 15.5%.  She also had an AKI with creatinine of 3.37.  She was a started on IV fluid hydration and insulin drip per DKA protocol.  Eventually, DKA, HHS and AKI resolved.  She was transitioned to subcu insulin and discharge on Levemir, mealtime Humalog, Metformin and atorvastatin as below.  She met with dietitian and diabetic coordinator for comprehensive counseling on diabetic management.   See individual problem list below for more on hospital course.  Discharge Diagnoses:  Acute metabolic encephalopathy: POA.  Likely due to severe DKA/HHS.  Resolved.  New onset diabetes with severe DKA/HHS: DKA and HHS resolved.  A1c> 15.5%.  Hyperglycemic crisis likely due to recent steroid for sciatica. Recent Labs  Lab 06/11/20 1124 06/11/20 1611 06/11/20 2117 06/12/20 0634 06/12/20 1108  GLUCAP 325* 230* 107* 223* 262*  -Discharged on:  -Levemir 35 units twice daily,   -Humalog  15 units 3 times daily with meals  -Metformin 500 mg p.o. twice daily  -Atorvastatin 20 mg daily -See discharge instruction and counseling as below  AKI/azotemia: Likely prerenal from dehydration in the setting of hyperglycemic crisis.  Resolved. Recent Labs    06/09/20 1240 06/09/20 1325 06/09/20 1700 06/09/20 1701 06/09/20 2326 06/10/20 0344 06/10/20 0755 06/11/20 0109 06/12/20 0050  BUN 37* 41* 34* 23* 22* 24* 22* 20 15  CREATININE 2.37* 1.60* 2.17* 1.65* 1.36* 1.16* 1.16* 1.10* 1.00    Leukocytosis/bandemia: Likely demargination due to steroid and hemoconcentration.  Improved.  Polycythemia: Likely due to hemoconcentration.  Resolved.  Sciatica on left side: Stable. -Outpatient follow-up  Hypokalemia: Replenished prior to discharge.  Morbid obesity: -Encourage weight loss -May consider GLP-1 inhibitors Body mass index is 38.36 kg/m. Nutrition Problem: Food and nutrition related knowledge deficit Etiology:  (newly dx DM) Signs/Symptoms:  (consult for DM education)        Discharge Exam: Vitals:   06/12/20 0825 06/12/20 1111  BP: 113/82 (!) 115/91  Pulse: 99 97  Resp: 12 19  Temp: 97.7 F (36.5 C) 98 F (36.7 C)  SpO2: 96% 97%    GENERAL: No apparent distress.  Nontoxic. HEENT: MMM.  Vision and hearing grossly intact.  NECK: Supple.  No apparent JVD.  RESP: On RA.  No IWOB.  Fair aeration bilaterally. CVS:  RRR. Heart sounds normal.  ABD/GI/GU: Bowel sounds present. Soft. Non tender.  MSK/EXT:  Moves extremities. No apparent deformity. No edema.  SKIN: no apparent skin lesion or wound NEURO: Awake, alert and oriented appropriately.  No apparent focal  neuro deficit. PSYCH: Calm. Normal affect.   Discharge Instructions  Discharge Instructions    Amb Referral to Nutrition and Diabetic Education   Complete by: As directed    Newly dx DM; HgbA1c >15   Diet Carb Modified   Complete by: As directed    Discharge instructions   Complete by: As  directed    Managing your diabetes:  Your A1c is >15.5%. Your goal A1c is less than 7.0%. See below for more information about A1c.  Check your blood glucose 3-4 times a day before meals and keep blood glucose log.  Inject 35 units of Levemir twice a day before breakfast and dinner Inject 15 units of Humalog about 15 minutes 3 times a day before each meals Take Metformin 500 mg twice a day Your goal blood glucose is from 120 to 180. If higher, you can slowly go up on your Levemir by 1-2 units.  Eat regular meals (breakfast, lunch and dinner) around-the-clock. You may snack as needed. See below about few tips and recommendations on diet and exercise.  Watch for symptoms of low blood sugar (hypoglycemia). Read below about these symptoms and management. Please follow-up with your primary care doctor in 1 to 2 weeks.  Take your medications and blood glucose log to your follow-up. You also need an eye exam as soon as possible.  Please find an eye doctor and schedule an appointment as soon as possible. Do not walk barefoot.  Wear appropriate shoe with good cushion . Check your feet for any skin break or wound daily.  What is A1c:  The A1C test result reflects your average blood sugar level for the past two to three months. Specifically, the A1C test measures what percentage of your hemoglobin - a protein in red blood cells that carries oxygen - is coated with sugar (glycated). The higher your A1C level, the poorer your blood sugar control and the higher your risk of diabetes complications.  Lifestyle (diet and exercise): Choose healthier foods such as 100% whole grains, vegetables, fruits, beans, nut seeds, olive oil, most vegetable oils, fat-free dietary, wild game and fish.  Avoid sweet tea, other sweetened beverages, soda, fruit juice, cold cereal and milk and trans fat.  Eat at least 3 meals and 1-2 snacks per day.  Aim for no more than 5 hours between eating.  Eat breakfast within one hour of  getting up.   Exercise at least 150 minutes per week, including weight resistance exercises 3 or 4 times per week.  Try to lose at least 7-10% of your current body weight.   Hypoglycemia Hypoglycemia is when the sugar (glucose) level in the blood is too low. Symptoms of low blood sugar may include: Feeling: Hungry. Worried or nervous (anxious). Sweaty and clammy. Confused. Dizzy. Sleepy. Sick to your stomach (nauseous). Having: A fast heartbeat. A headache. A change in your vision. Jerky movements that you cannot control (seizure). Nightmares. Tingling or no feeling (numbness) around the mouth, lips, or tongue. Having trouble with: Talking. Paying attention (concentrating). Moving (coordination). Sleeping. Shaking. Passing out (fainting). Getting upset easily (irritability).  Low blood sugar can happen to people who have diabetes and people who do not have diabetes. Low blood sugar can happen quickly, and it can be an emergency. Treating Low Blood Sugar Low blood sugar is often treated by eating or drinking something sugary right away. If you can think clearly and swallow safely, follow the 15:15 rule: Take 15 grams of a fast-acting carb (carbohydrate). Some  fast-acting carbs are: 1 tube of glucose gel. 3 sugar tablets (glucose pills). 6-8 pieces of hard candy. 4 oz (120 mL) of fruit juice. 4 oz (120 mL) of regular (not diet) soda. Check your blood sugar 15 minutes after you take the carb. If your blood sugar is still at or below 70 mg/dL (3.9 mmol/L), take 15 grams of a carb again. If your blood sugar does not go above 70 mg/dL (3.9 mmol/L) after 3 tries, get help right away. After your blood sugar goes back to normal, eat a meal or a snack within 1 hour.   Treating Very Low Blood Sugar If your blood sugar is at or below 54 mg/dL (3 mmol/L), you have very low blood sugar (severe hypoglycemia). This is an emergency. Do not wait to see if the symptoms will go away. Get  medical help right away. Call your local emergency services (911 in the [place] Do not drive yourself to the hospital. If you have very low blood sugar and you cannot eat or drink, you may need a glucagon shot (injection). A family member or friend should learn how to check your blood sugar and how to give you a glucagon shot. Ask your doctor if you need to have a glucagon shot kit at home. Follow these instructions at home: General instructions Avoid any diets that cause you to not eat enough food. Talk with your doctor before you start any new diet. Take over-the-counter and prescription medicines only as told by your doctor. Limit alcohol to no more than 1 drink per day for nonpregnant women and 2 drinks per day for men. One drink equals 12 oz of beer, 5 oz of wine, or 1 oz of hard liquor. Keep all follow-up visits as told by your doctor. This is important. If You Have Diabetes: ? Make sure you know the symptoms of low blood sugar. Always keep a source of sugar with you, such as: Sugar. Sugar tablets. Glucose gel. Fruit juice. Regular soda (not diet soda). Milk. Hard candy. Honey. Take your medicines as told. Follow your exercise and meal plan. Eat on time. Do not skip meals. Follow your sick day plan when you cannot eat or drink normally. Make this plan ahead of time with your doctor. Check your blood sugar as often as told by your doctor. Always check before and after exercise. Share your diabetes care plan with: Your work or school. People you live with. Check your pee (urine) for ketones: When you are sick. As told by your doctor. Carry a card or wear jewelry that says you have diabetes. If You Have Low Blood Sugar From Other Causes: ? Check your blood sugar as often as told by your doctor. Follow instructions from your doctor about what you cannot eat or drink. Contact a doctor if: You have trouble keeping your blood sugar in your target range. You have low blood sugar  often. Get help right away if: You still have symptoms after you eat or drink something sugary. Your blood sugar is at or below 54 mg/dL (3 mmol/L). You have jerky movements that you cannot control.   Increase activity slowly   Complete by: As directed      Allergies as of 06/12/2020   No Known Allergies     Medication List    STOP taking these medications   predniSONE 20 MG tablet Commonly known as: DELTASONE     TAKE these medications   atorvastatin 20 MG tablet Commonly known as: LIPITOR  Take 1 tablet (20 mg total) by mouth daily. Start taking on: June 13, 2020   blood glucose meter kit and supplies Dispense based on patient and insurance preference. Use up to four times daily as directed. (FOR ICD-10 E10.9, E11.9).   cyclobenzaprine 10 MG tablet Commonly known as: FLEXERIL Take 1 tablet (10 mg total) by mouth 3 (three) times daily as needed for muscle spasms. DO NOT DRINK ALCOHOL OR DRIVE WHILE TAKING THIS MEDICATION   insulin lispro 100 UNIT/ML KwikPen Commonly known as: HumaLOG KwikPen Inject 15 Units into the skin 3 (three) times daily.   Levemir FlexTouch 100 UNIT/ML FlexPen Generic drug: insulin detemir Inject 35 Units into the skin 2 (two) times daily.   metFORMIN 500 MG tablet Commonly known as: Glucophage Take 1 tablet (500 mg total) by mouth 2 (two) times daily with a meal.   Norethindrone-Ethinyl Estradiol-Fe Biphas 1 MG-10 MCG / 10 MCG tablet Commonly known as: LO LOESTRIN FE Take 1 tablet by mouth daily.   Pen Needles 31G X 6 MM Misc Use to inject insulin (Uses 5 times a day)       Consultations:  None  Procedures/Studies:   CT Head Wo Contrast  Result Date: 06/09/2020 CLINICAL DATA:  Dizziness EXAM: CT HEAD WITHOUT CONTRAST TECHNIQUE: Contiguous axial images were obtained from the base of the skull through the vertex without intravenous contrast. COMPARISON:  None. FINDINGS: Brain: Ventricles and sulci are normal in size and  configuration. There is no intracranial mass, hemorrhage, extra-axial fluid collection, or midline shift. Brain parenchyma appears unremarkable. No evident acute infarct. Vascular: No hyperdense vessel.  No evident vascular calcification. Skull: Bony calvarium appears intact. Sinuses/Orbits: Visualized paranasal sinuses are clear. Orbits appear symmetric bilaterally. Other: Mastoid air cells are clear. IMPRESSION: Study within normal limits. Electronically Signed   By: Lowella Grip III M.D.   On: 06/09/2020 14:41   DG Chest Port 1 View  Result Date: 06/09/2020 CLINICAL DATA:  Dyspnea, dizziness, polydipsia EXAM: PORTABLE CHEST 1 VIEW COMPARISON:  None. FINDINGS: The heart size and mediastinal contours are within normal limits. Both lungs are clear. The visualized skeletal structures are unremarkable. IMPRESSION: No active disease. Electronically Signed   By: Randa Ngo M.D.   On: 06/09/2020 15:12        The results of significant diagnostics from this hospitalization (including imaging, microbiology, ancillary and laboratory) are listed below for reference.     Microbiology: Recent Results (from the past 240 hour(s))  MRSA PCR Screening     Status: None   Collection Time: 06/09/20  2:50 AM   Specimen: Nasopharyngeal  Result Value Ref Range Status   MRSA by PCR NEGATIVE NEGATIVE Final    Comment:        The GeneXpert MRSA Assay (FDA approved for NASAL specimens only), is one component of a comprehensive MRSA colonization surveillance program. It is not intended to diagnose MRSA infection nor to guide or monitor treatment for MRSA infections. Performed at [facility], [place] [address], [place], [place] [address]   Resp Panel by RT-PCR (Flu A&B, Covid) Nasopharyngeal Swab     Status: None   Collection Time: 06/09/20  1:52 PM   Specimen: Nasopharyngeal Swab; Nasopharyngeal(NP) swabs in vial transport medium  Result Value Ref Range Status   SARS Coronavirus 2 by RT PCR  NEGATIVE NEGATIVE Final    Comment: (NOTE) SARS-CoV-2 target nucleic acids are NOT DETECTED.  The SARS-CoV-2 RNA is generally detectable in upper respiratory specimens during the  acute phase of infection. The lowest concentration of SARS-CoV-2 viral copies this assay can detect is 138 copies/mL. A negative result does not preclude SARS-Cov-2 infection and should not be used as the sole basis for treatment or other patient management decisions. A negative result may occur with  improper specimen collection/handling, submission of specimen other than nasopharyngeal swab, presence of viral mutation(s) within the areas targeted by this assay, and inadequate number of viral copies(<138 copies/mL). A negative result must be combined with clinical observations, patient history, and epidemiological information. The expected result is Negative.  Fact Sheet for Patients:  EntrepreneurPulse.com.au  Fact Sheet for Healthcare Providers:  IncredibleEmployment.be  This test is no t yet approved or cleared by the [place] FDA and  has been authorized for detection and/or diagnosis of SARS-CoV-2 by FDA under an Emergency Use Authorization (EUA). This EUA will remain  in effect (meaning this test can be used) for the duration of the COVID-19 declaration under Section 564(b)(1) of the Act, 21 U.S.C.section 360bbb-3(b)(1), unless the authorization is terminated  or revoked sooner.       Influenza A by PCR NEGATIVE NEGATIVE Final   Influenza B by PCR NEGATIVE NEGATIVE Final    Comment: (NOTE) The Xpert Xpress SARS-CoV-2/FLU/RSV plus assay is intended as an aid in the diagnosis of influenza from Nasopharyngeal swab specimens and should not be used as a sole basis for treatment. Nasal washings and aspirates are unacceptable for Xpert Xpress SARS-CoV-2/FLU/RSV testing.  Fact Sheet for Patients: EntrepreneurPulse.com.au  Fact Sheet for  Healthcare Providers: IncredibleEmployment.be  This test is not yet approved or cleared by the [place] FDA and has been authorized for detection and/or diagnosis of SARS-CoV-2 by FDA under an Emergency Use Authorization (EUA). This EUA will remain in effect (meaning this test can be used) for the duration of the COVID-19 declaration under Section 564(b)(1) of the Act, 21 U.S.C. section 360bbb-3(b)(1), unless the authorization is terminated or revoked.  Performed at [facility], [place] [address] [place], [place] [address]      Labs:  CBC: Recent Labs  Lab 06/09/20 1240 06/09/20 1325 06/09/20 1330 06/10/20 0344 06/11/20 0109 06/12/20 0050  WBC 15.9*  --   --  15.4* 13.2* 12.9*  HGB 17.2* 18.7* 17.7* 15.5* 15.3* 13.4  HCT 55.4* 55.0* 52.0* 47.4* 44.9 41.6  MCV 88.8  --   --  82.7 81.8 84.4  PLT 321  --   --  217 163 139*   BMP &GFR Recent Labs  Lab 06/09/20 2326 06/10/20 0344 06/10/20 0755 06/11/20 0109 06/12/20 0050  NA 152* 153* 153* 145 143  K 4.1 3.4* 3.1* 3.4* 3.1*  CL 115* 118* 115* 109 106  CO2 $Re'26 25 27 25 29  'EBI$ GLUCOSE 343* 254* 245* 321* 91  BUN 22* 24* 22* 20 15  CREATININE 1.36* 1.16* 1.16* 1.10* 1.00  CALCIUM 9.7 9.5 9.6 9.3 8.8*  MG  --   --   --  2.6* 2.4   Estimated Creatinine Clearance: 90.1 mL/min (by C-G formula based on SCr of 1 mg/dL). Liver & Pancreas: Recent Labs  Lab 06/10/20 0344  AST 17  ALT 19  ALKPHOS 95  BILITOT 0.6  PROT 6.0*  ALBUMIN 3.1*   No results for input(s): LIPASE, AMYLASE in the last 168 hours. No results for input(s): AMMONIA in the last 168 hours. Diabetic: No results for input(s): HGBA1C in the last 72 hours. Recent Labs  Lab 06/11/20 1124 06/11/20 1611 06/11/20 2117 06/12/20 8676  06/12/20 1108  GLUCAP 325* 230* 107* 223* 262*   Cardiac Enzymes: No results for input(s): CKTOTAL, CKMB, CKMBINDEX, TROPONINI in the last 168 hours. No results for input(s): PROBNP in the last  8760 hours. Coagulation Profile: No results for input(s): INR, PROTIME in the last 168 hours. Thyroid Function Tests: No results for input(s): TSH, T4TOTAL, FREET4, T3FREE, THYROIDAB in the last 72 hours. Lipid Profile: Recent Labs    06/12/20 0050  CHOL 225*  HDL 30*  LDLCALC 150*  TRIG 226*  CHOLHDL 7.5   Anemia Panel: No results for input(s): VITAMINB12, FOLATE, FERRITIN, TIBC, IRON, RETICCTPCT in the last 72 hours. Urine analysis:    Component Value Date/Time   COLORURINE STRAW (A) 06/09/2020 1240   APPEARANCEUR CLEAR 06/09/2020 1240   LABSPEC 1.030 06/09/2020 1240   PHURINE 5.0 06/09/2020 1240   GLUCOSEU >=500 (A) 06/09/2020 1240   HGBUR SMALL (A) 06/09/2020 1240   BILIRUBINUR NEGATIVE 06/09/2020 1240   KETONESUR 80 (A) 06/09/2020 1240   PROTEINUR NEGATIVE 06/09/2020 1240   NITRITE NEGATIVE 06/09/2020 1240   LEUKOCYTESUR NEGATIVE 06/09/2020 1240   Sepsis Labs: Invalid input(s): PROCALCITONIN, LACTICIDVEN   Time coordinating discharge: 40 minutes  SIGNED:  Mercy Riding, MD  [facility] 06/12/2020, 5:55 PM  If 7PM-7AM, please contact night-coverage [email or web address]

## 2020-06-12 NOTE — TOC Progression Note (Signed)
Transition of Care [facility]) - Progression Note    Patient Details  Name: Whitney Baker MRN: [id number] Date of Birth: 1975/11/17  Transition of [facility]) CM/SW Contact  Leone Haven, RN Phone Number: 06/12/2020, 10:27 AM  Clinical Narrative:    Patient has insurance and states she can pay for her medications. She has a follow up wtiht [facility]  On [address] for her PCP.          Expected Discharge Plan and Services           Expected Discharge Date: 06/12/20                                     Social Determinants of Health (SDOH) Interventions    Readmission Risk Interventions No flowsheet data found.

## 2020-06-15 ENCOUNTER — Ambulatory Visit (INDEPENDENT_AMBULATORY_CARE_PROVIDER_SITE_OTHER): Payer: BC Managed Care – PPO | Admitting: Family Medicine

## 2020-06-15 ENCOUNTER — Other Ambulatory Visit: Payer: Self-pay

## 2020-06-15 ENCOUNTER — Encounter: Payer: Self-pay | Admitting: Family Medicine

## 2020-06-15 VITALS — BP 110/72 | HR 87 | Ht 68.0 in | Wt 253.6 lb

## 2020-06-15 DIAGNOSIS — E119 Type 2 diabetes mellitus without complications: Secondary | ICD-10-CM

## 2020-06-15 DIAGNOSIS — E1110 Type 2 diabetes mellitus with ketoacidosis without coma: Secondary | ICD-10-CM

## 2020-06-15 MED ORDER — ONETOUCH DELICA LANCETS 33G MISC: 1 refills | Status: DC

## 2020-06-15 MED ORDER — ONETOUCH VERIO VI STRP: ORAL_STRIP | 1 refills | Status: DC

## 2020-06-15 NOTE — Patient Instructions (Signed)
I recommend cutting back your Humalog insulin to 10 units 3 times daily with meals.  I also recommend cutting back the Levemir to 25 units twice daily.  It is okay to hold the Metformin for now.  Continue keeping a close eye on your blood sugar.  I recommend checking daily fasting (6 to 8 hours without eating or drinking anything except water) I also recommend checking 2 hours after a meal  Goal fasting blood sugars 80-130  Goal 2-hour postprandial blood sugars 130- 180  If you have blood sugars below 80, we will need to consider cutting back your insulin even more.  I am referring you to an endocrinologist and they will call you to schedule a visit.

## 2020-06-15 NOTE — Progress Notes (Signed)
Subjective:    Patient ID: Whitney Baker, female    DOB: 10-17-1975, 45 y.o.   MRN: [id number]  HPI Chief Complaint  Patient presents with  . new pt     New pt get established- with to Urgent Care with sciatic nerve and was given med but also found out she has DM, BS 189   She is new to the practice and here to get established and for new onset diabetes.  She also presents with FMLA paperwork. OB/GYN- Henreitta Leber   Reports new diagnosis of diabetes as of March 2022.  She was hospitalized with DKA from 06/09/2020 to 06/12/2020. She presented to the ED on 06/09/2020 with altered mental status and elevated glucose as high as 1403.  Her A1c was greater than 15.5%.  She also had acute kidney injury with a creatinine of 3.37.  She was discharged on Levemir, mealtime Humalog, Metformin and atorvastatin.  She has an upcoming appointment with a dietitian.  She also reports having a diabetic eye exam scheduled later this week with Dr. Shea Evans.  States she had counseling with the diabetic educator before being discharged from the hospital.  She brought in a list of blood glucose levels.  She has been checking her blood sugar every couple of hours including shortly after eating or waking up in the middle the night.  States she is "OCD about blood sugars right now" She had a blood sugar in the 50 range.  States this was because she did not eat with her mealtime insulin.  She has had the majority of blood sugars between 80 and 130.  Did not take metformin today. States she is having diarrhea with it.  She would like to stop this medication.  She is on birth control pills.  No family history of DM    Social history: Lives alone   Reviewed allergies, medications, past medical, surgical, family, and social history.    Review of Systems Pertinent positives and negatives in the history of present illness.     Objective:   Physical Exam BP 110/72   Pulse 87   Ht 5\' 8"  (1.727 m)    Wt 253 lb 9.6 oz (115 kg)   SpO2 98%   BMI 38.56 kg/m   Alert and oriented and in no distress.  Respirations unlabored.  Normal speech, mood and thought process.      Assessment & Plan:  New onset type 2 diabetes mellitus (HCC) - Plan: CBC with Differential/Platelet, Comprehensive metabolic panel, Ambulatory referral to Endocrinology  Diabetic ketoacidosis without coma associated with type 2 diabetes mellitus (HCC) - Plan: Ambulatory referral to Endocrinology  She is a 45 year old female who is new to the practice and here to establish care.  New onset diabetes and hospitalized for DKA.  I am reducing her insulin doses due to blood sugars mainly between 80 and 130 as well as hypoglycemia with her blood sugar in the 50s. I am reducing her Levemir from 35 units twice daily to 25 units twice daily. Reduce Humalog from 15 units 3 times daily with meals to 10 units 3 times daily with meals. She may stop Metformin for now since it is causing significant GI upset. Continue atorvastatin. I am referring her to Endocrinology for better control and management of her diabetes. She will see the dietitian tomorrow and have a diabetic eye exam later this week. Discussed that I cannot fill out her FMLA paperwork today due to diabetes.  Discussed that we should  be able to get her diabetes under good control which will allow her to have a relatively normal life and function with work as usual.  She expressed her dissatisfaction with the fact that I will not fill out her FMLA.

## 2020-06-16 LAB — CBC WITH DIFFERENTIAL/PLATELET
Basophils Absolute: 0.1 10*3/uL (ref 0.0–0.2)
Basos: 1 %
EOS (ABSOLUTE): 0.2 10*3/uL (ref 0.0–0.4)
Eos: 2 %
Hematocrit: 36.6 % (ref 34.0–46.6)
Hemoglobin: 12 g/dL (ref 11.1–15.9)
Immature Grans (Abs): 0.6 10*3/uL — ABNORMAL HIGH (ref 0.0–0.1)
Immature Granulocytes: 5 %
Lymphocytes Absolute: 4.9 10*3/uL — ABNORMAL HIGH (ref 0.7–3.1)
Lymphs: 45 %
MCH: 27.4 pg (ref 26.6–33.0)
MCHC: 32.8 g/dL (ref 31.5–35.7)
MCV: 84 fL (ref 79–97)
Monocytes Absolute: 1 10*3/uL — ABNORMAL HIGH (ref 0.1–0.9)
Monocytes: 9 %
Neutrophils Absolute: 4.2 10*3/uL (ref 1.4–7.0)
Neutrophils: 38 %
Platelets: 314 10*3/uL (ref 150–450)
RBC: 4.38 x10E6/uL (ref 3.77–5.28)
RDW: 12.5 % (ref 11.7–15.4)
WBC: 10.9 10*3/uL — ABNORMAL HIGH (ref 3.4–10.8)

## 2020-06-16 LAB — COMPREHENSIVE METABOLIC PANEL
ALT: 22 IU/L (ref 0–32)
AST: 23 IU/L (ref 0–40)
Albumin/Globulin Ratio: 1.1 — ABNORMAL LOW (ref 1.2–2.2)
Albumin: 3.3 g/dL — ABNORMAL LOW (ref 3.8–4.8)
Alkaline Phosphatase: 169 IU/L — ABNORMAL HIGH (ref 44–121)
BUN/Creatinine Ratio: 14 (ref 9–23)
BUN: 10 mg/dL (ref 6–24)
Bilirubin Total: 0.8 mg/dL (ref 0.0–1.2)
CO2: 22 mmol/L (ref 20–29)
Calcium: 8.9 mg/dL (ref 8.7–10.2)
Chloride: 99 mmol/L (ref 96–106)
Creatinine, Ser: 0.74 mg/dL (ref 0.57–1.00)
Globulin, Total: 3 g/dL (ref 1.5–4.5)
Glucose: 233 mg/dL — ABNORMAL HIGH (ref 65–99)
Potassium: 4.3 mmol/L (ref 3.5–5.2)
Sodium: 137 mmol/L (ref 134–144)
Total Protein: 6.3 g/dL (ref 6.0–8.5)
eGFR: 103 mL/min/{1.73_m2} (ref >59)

## 2020-06-16 NOTE — Progress Notes (Signed)
Her labs have overall improved but she should have a recheck of her alkaline phosphatase at some pint in the next few months. This can be seen with liver abnormalities but her other liver tests are normal.  Most likely it is related to her recent high blood sugars and will improve.

## 2020-06-18 DIAGNOSIS — E113293 Type 2 diabetes mellitus with mild nonproliferative diabetic retinopathy without macular edema, bilateral: Secondary | ICD-10-CM | POA: Diagnosis not present

## 2020-06-21 ENCOUNTER — Encounter: Payer: Self-pay | Admitting: Internal Medicine

## 2020-06-21 ENCOUNTER — Telehealth: Payer: Self-pay | Admitting: Internal Medicine

## 2020-06-21 ENCOUNTER — Other Ambulatory Visit: Payer: Self-pay

## 2020-06-21 ENCOUNTER — Ambulatory Visit (INDEPENDENT_AMBULATORY_CARE_PROVIDER_SITE_OTHER): Payer: BC Managed Care – PPO | Admitting: Internal Medicine

## 2020-06-21 VITALS — BP 122/82 | HR 90 | Ht 68.0 in | Wt 258.0 lb

## 2020-06-21 DIAGNOSIS — E119 Type 2 diabetes mellitus without complications: Secondary | ICD-10-CM | POA: Diagnosis not present

## 2020-06-21 DIAGNOSIS — E0865 Diabetes mellitus due to underlying condition with hyperglycemia: Secondary | ICD-10-CM | POA: Diagnosis not present

## 2020-06-21 DIAGNOSIS — E785 Hyperlipidemia, unspecified: Secondary | ICD-10-CM

## 2020-06-21 MED ORDER — DEXCOM G6 SENSOR MISC: 1.0000 | 3 refills | Status: DC

## 2020-06-21 MED ORDER — DEXCOM G6 TRANSMITTER MISC: 1.0000 | 3 refills | Status: DC

## 2020-06-21 MED ORDER — TRESIBA FLEXTOUCH 100 UNIT/ML ~~LOC~~ SOPN: 30.0000 [IU] | PEN_INJECTOR | Freq: Every day | SUBCUTANEOUS | 1 refills | Status: DC

## 2020-06-21 NOTE — Patient Instructions (Addendum)
-   Levemir 20 units twice daily - Then start Tresiba 30 units once daily  - Humalog 10 units with each meal       HOW TO TREAT LOW BLOOD SUGARS (Blood sugar LESS THAN 70 MG/DL)  Please follow the RULE OF 15 for the treatment of hypoglycemia treatment (when your (blood sugars are less than 70 mg/dL)    STEP 1: Take 15 grams of carbohydrates when your blood sugar is low, which includes:   3-4 GLUCOSE TABS  OR  3-4 OZ OF JUICE OR REGULAR SODA OR  ONE TUBE OF GLUCOSE GEL     STEP 2: RECHECK blood sugar in 15 MINUTES STEP 3: If your blood sugar is still low at the 15 minute recheck --> then, go back to STEP 1 and treat AGAIN with another 15 grams of carbohydrates.

## 2020-06-21 NOTE — Telephone Encounter (Signed)
Patient called back after today's initial visit and wants to ask the following 2 questions: #1 - Wants to know what is considered high blood sugar reading and #2 What does she do if she has a high blood sugar reading?    Call back [phone]

## 2020-06-21 NOTE — Progress Notes (Signed)
Name: Whitney Baker  MRN/ DOB: [id number], 01/19/1976   Age/ Sex: 45 y.o., female    PCP: Henson, Vickie L, NP-C   Reason for Endocrinology Evaluation: Type 2 Diabetes Mellitus     Date of Initial Endocrinology Visit: 06/21/2020     PATIENT IDENTIFIER: [name] is a 45 y.o. female with a past medical history of DM  The patient presented for initial endocrinology clinic visit on 06/21/2020 for consultative assistance with her diabetes management.    HPI: Whitney Baker was    Diagnosed with DM in 05/2020. She presented to the Ed with a serum glucose of 1,434 mg/dL with an AG of 32 and  Bicarb level of 11. She presented with back pains, was on prednisone for approximately 4 days prior to her presentation to the ED  and flexeril.  Prior Medications tried/Intolerance: N/A Currently checking blood sugars 6 x / day Hypoglycemia episodes : yes            Symptoms: yes               Hemoglobin A1c > 15.5% Patient required assistance for hypoglycemia:  Patient has required hospitalization within the last 1 year from hyper or hypoglycemia:   In terms of diet, the patient changed her diet drastically since her diagnosis   Metformin caused diarrhea.   Great grandmother with questionable DM  No Fh of Thyroid disease     HOME DIABETES REGIMEN: Levemir 20- 25 units BID ( Breakfast and Dinner)  Humalog 10 units with each meal  Metformin 500 mg BID - not taking 3/29 due to diarrhea    Statin: yes ACE-I/ARB: no Prior Diabetic Education: has an appointment next week    METER DOWNLOAD SUMMARY: Date range evaluated: 3/21-06/21/2020  Average Number Tests/Day = 6 Overall Mean FS Glucose = 142 Standard Deviation = 256  BG Ranges: Low = 58 High = 256   Hypoglycemic Events/30 Days: BG < 50 = 0 Episodes of symptomatic severe hypoglycemia = 0   DIABETIC COMPLICATIONS: Microvascular complications:   Cataract   Denies: CKD, neuropathy   Last eye exam: Completed  06/2020  Macrovascular complications:    Denies: CAD, PVD, CVA   PAST HISTORY: Past Medical History:  Past Medical History:  Diagnosis Date  . Hidradenitis   . Obesity   . Sciatic leg pain     Past Surgical History: No past surgical history on file.   Social History:  reports that she has quit smoking. She has never used smokeless tobacco. She reports current alcohol use. She reports that she does not use drugs. Family History:  Family History  Problem Relation Age of Onset  . Hypertension Mother   . Hyperlipidemia Mother   . Osteoarthritis Mother   . Diabetes Maternal Grandmother      HOME MEDICATIONS: Allergies as of 06/21/2020   No Known Allergies     Medication List       Accurate as of June 21, 2020  3:30 PM. If you have any questions, ask your nurse or doctor.        STOP taking these medications   Levemir FlexTouch 100 UNIT/ML FlexPen Generic drug: insulin detemir Stopped by: Ibtehal J Shamleffer, MD   metFORMIN 500 MG tablet Commonly known as: Glucophage Stopped by: Ibtehal J Shamleffer, MD     TAKE these medications   atorvastatin 20 MG tablet Commonly known as: LIPITOR Take 1 tablet (20 mg total) by mouth daily.   blood glucose   meter kit and supplies Dispense based on patient and insurance preference. Use up to four times daily as directed. (FOR ICD-10 E10.9, E11.9).   cyclobenzaprine 10 MG tablet Commonly known as: FLEXERIL Take 1 tablet (10 mg total) by mouth 3 (three) times daily as needed for muscle spasms. DO NOT DRINK ALCOHOL OR DRIVE WHILE TAKING THIS MEDICATION   Dexcom G6 Sensor Misc 1 Device by Does not apply route as directed. Started by: Dorita Sciara, MD   Dexcom G6 Transmitter Misc 1 Device by Does not apply route as directed. Started by: Dorita Sciara, MD   insulin lispro 100 UNIT/ML KwikPen Commonly known as: HumaLOG KwikPen Inject 15 Units into the skin 3 (three) times daily. What changed: how much to  take   Norethindrone-Ethinyl Estradiol-Fe Biphas 1 MG-10 MCG / 10 MCG tablet Commonly known as: LO LOESTRIN FE Take 1 tablet by mouth daily.   OneTouch Delica Lancets 68G Misc Test twice a day   OneTouch Verio test strip Generic drug: glucose blood Test twice a day   Pen Needles 31G X 6 MM Misc Use to inject insulin (Uses 5 times a day)   Tyler Aas FlexTouch 100 UNIT/ML FlexTouch Pen Generic drug: insulin degludec Inject 30 Units into the skin daily. Started by: Dorita Sciara, MD        ALLERGIES: No Known Allergies   REVIEW OF SYSTEMS: A comprehensive ROS was conducted with the patient and is negative except as per HPI and below:  Review of Systems  Gastrointestinal: Negative for diarrhea and vomiting.  Neurological: Positive for tingling.      OBJECTIVE:   VITAL SIGNS: BP 122/82   Pulse 90   Ht 5' 8" (1.727 m)   Wt 258 lb (117 kg)   SpO2 99%   BMI 39.23 kg/m    PHYSICAL EXAM:  General: Pt appears well and is in NAD  Hydration: Well-hydrated with moist mucous membranes and good skin turgor  HEENT: Head: Unremarkable with good dentition. Oropharynx clear without exudate.  Eyes: External eye exam normal without stare, lid lag or exophthalmos.  EOM intact.    Neck: General: Supple without adenopathy or carotid bruits. Thyroid: Thyroid size normal.  No goiter or nodules appreciated.   Lungs: Clear with good BS bilat with no rales, rhonchi, or wheezes  Heart: RRR with normal S1 and S2 and no gallops; no murmurs; no rub  Abdomen: Normoactive bowel sounds, soft, nontender, without masses or organomegaly palpable  Extremities:  Lower extremities - Trace pretibial edema.  Skin: Normal texture and temperature to palpation. No rash noted. No Acanthosis nigricans/skin tags. No lipohypertrophy.  Neuro: MS is good with appropriate affect, pt is alert and Ox3    DM foot exam: 06/21/2020  The skin of the feet is intact without sores or ulcerations. The pedal  pulses are 2+ on right and 2+ on left. The sensation is intact to a screening 5.07, 10 gram monofilament bilaterally   DATA REVIEWED:  Lab Results  Component Value Date   HGBA1C >15.5 (H) 06/09/2020   Lab Results  Component Value Date   LDLCALC 150 (H) 06/12/2020   CREATININE 0.74 06/15/2020   No results found for: [facility]  Lab Results  Component Value Date   CHOL 225 (H) 06/12/2020   HDL 30 (L) 06/12/2020   LDLCALC 150 (H) 06/12/2020   TRIG 226 (H) 06/12/2020   CHOLHDL 7.5 06/12/2020        ASSESSMENT / PLAN / RECOMMENDATIONS:   1)  Type 2 Diabetes Mellitus, Newly Diagnosed controlled - Most recent A1c of > 15% %. Goal A1c < 7.0 %.    Plan: GENERAL: I have discussed with the patient the pathophysiology of diabetes. We went over the natural progression of the disease. We talked about both insulin resistance and insulin deficiency. We stressed the importance of lifestyle changes including diet and exercise. I explained the complications associated with diabetes including retinopathy, nephropathy, neuropathy as well as increased risk of cardiovascular disease. We went over the benefit seen with glycemic control.    I explained to the patient that diabetic patients are at higher than normal risk for amputations.  Discussed pharmacokinetics of basal/bolus insulin and the importance of taking prandial insulin with meals.   We also discussed avoiding sugar-sweetened beverages and snacks, when possible.   She is intolerant to Metformin   Discussed ADD on therpay with G:P-1 agonists, discussed benefits as well as risks but she would like to discuss with mother   Dexcom prescription sent   Will switch Levemir  To Tresiba , in the meantime she can use levemir 20 units BID   Needs FMLA   phenotypically T2DM but given acidosis will check GAD-65 and Islet cell Ab's   MEDICATIONS:  Tresiba 30 units daily   Humalog 10 units TIDQAC   EDUCATION / INSTRUCTIONS:  BG  monitoring instructions: Patient is instructed to check her blood sugars 4  times a day, before meals and bedtime .  Call [facility] if: BG persistently < 70  . I reviewed the Rule of 15 for the treatment of hypoglycemia in detail with the patient. Literature supplied.   2) Diabetic complications:   Eye: Does not have known diabetic retinopathy.   Neuro/ Feet: Does not have known diabetic peripheral neuropathy.  Renal: Patient does not have known baseline CKD. She is not on an ACEI/ARB at present.   3) Dyslipidemia:   - LDL hight at 150 mg/dL , Tg 226 mg/dL . Discussed cardiovascular benefits of statins    Continue  Lipitor 20 mg daily     I spent 45  minutes preparing to see the patient by review of recent labs, imaging and procedures, obtaining and reviewing separately obtained history, communicating with the patient/family or caregiver, ordering medications, tests or procedures, and documenting clinical information in the EHR including the differential Dx, treatment, and any further evaluation and other management     F/U in 3 months   Signed electronically by: Abby Jaralla Shamleffer, MD  [facility]  [facility] [address] [place], [place] [address] Phone: [phone] FAX: [phone]   CC: Henson, Vickie L, NP-C [address] [place] [address] Phone: [phone]  Fax: [phone]    Return to Endocrinology clinic as below: Future Appointments  Date Time Provider Department Center  09/24/2020  9:50 AM Shamleffer, Ibtehal Jaralla, MD [facility] None     

## 2020-06-21 NOTE — Telephone Encounter (Signed)
Spoken to patient and notified Dr Harvel Ricks comments. Verbalized understanding.   FMLA paperwork left in the front office.

## 2020-06-21 NOTE — Telephone Encounter (Signed)
Definition of high sugar is fasting above 130 mg/dL  Definition of high sugar during the day is over 180 mg/dL   If its high, she can add 2 units of humalog to her meal ( for example if her pre-meal sugar is over 200) she can take 12 units of humalog for that meal instead of 10 units . She should not give extra humalog within 4 hours of each other

## 2020-06-22 NOTE — Telephone Encounter (Signed)
Please advise 

## 2020-06-22 NOTE — Telephone Encounter (Signed)
Spoken to patient and notified Dr Shamleffer's comments. Verbalized understanding.   

## 2020-06-22 NOTE — Telephone Encounter (Signed)
No. I can not change it. I already told her that I don;t do FMLA's and we agreed to do it until 4/15

## 2020-06-22 NOTE — Telephone Encounter (Signed)
Patient called and is asking if her the date on her FMLA paperwork can be changed from the 07/02/20 to 07/18/20 - since she has several doctors appointments and can not see??  Call back number [phone]

## 2020-07-01 ENCOUNTER — Telehealth: Payer: Self-pay | Admitting: Internal Medicine

## 2020-07-01 ENCOUNTER — Telehealth: Payer: Self-pay | Admitting: Family Medicine

## 2020-07-01 DIAGNOSIS — G5702 Lesion of sciatic nerve, left lower limb: Secondary | ICD-10-CM

## 2020-07-01 NOTE — Telephone Encounter (Signed)
Please assist her with this  

## 2020-07-01 NOTE — Telephone Encounter (Signed)
Pt called and wanted to see if she could get a referral to see ortho because she is still having issues with her sciatic nerve. The pain is now going down to her hip and leg

## 2020-07-01 NOTE — Telephone Encounter (Signed)
MEDICATION: Dexcom G6 Receiver  PHARMACY:  [facility]  HAS THE PATIENT CONTACTED THEIR PHARMACY?  No, NEW RX  IS THIS A 90 DAY SUPPLY :   IS PATIENT OUT OF MEDICATION: NEW RX  IF NOT; HOW MUCH IS LEFT: NEW RX  LAST APPOINTMENT DATE: @4 /06/2020  NEXT APPOINTMENT DATE:@7 /10/2020  DO WE HAVE YOUR PERMISSION TO LEAVE A DETAILED MESSAGE?: yes [phone]  OTHER COMMENTS: Let patient know about phone app and patient states does not want to use app because it has a 1 star rating   **Let patient know to contact pharmacy at the end of the day to make sure medication is ready. **  ** Please notify patient to allow 48-72 hours to process**  **Encourage patient to contact the pharmacy for refills or they can request refills through [facility]**

## 2020-07-01 NOTE — Telephone Encounter (Signed)
Pt was notified. Ortho referral as been placed

## 2020-07-05 MED ORDER — DEXCOM G6 RECEIVER DEVI: 0 refills | Status: DC

## 2020-07-05 NOTE — Telephone Encounter (Signed)
Dexcom receiver have been sent as requested.

## 2020-07-06 LAB — GLUTAMIC ACID DECARBOXYLASE AUTO ABS: Glutamic Acid Decarb Ab: <5 IU/mL (ref <5)

## 2020-07-06 LAB — ISLET CELL AB SCREEN RFLX TO TITER: ISLET CELL ANTIBODY SCREEN: NEGATIVE

## 2020-07-09 ENCOUNTER — Encounter: Payer: Self-pay | Admitting: Family Medicine

## 2020-07-09 ENCOUNTER — Other Ambulatory Visit: Payer: Self-pay

## 2020-07-09 ENCOUNTER — Ambulatory Visit (INDEPENDENT_AMBULATORY_CARE_PROVIDER_SITE_OTHER): Payer: BC Managed Care – PPO | Admitting: Family Medicine

## 2020-07-09 ENCOUNTER — Ambulatory Visit: Payer: Self-pay

## 2020-07-09 DIAGNOSIS — M5442 Lumbago with sciatica, left side: Secondary | ICD-10-CM | POA: Diagnosis not present

## 2020-07-09 NOTE — Progress Notes (Signed)
Office Visit Note   Patient: Whitney Baker           Date of Birth: [birth year]           MRN: [id number] Visit Date: 07/09/2020 Requested by: Avanell Shackleton, NP-C [address] [place],  [place] [address] PCP: Avanell Shackleton, NP-C  Subjective: Chief Complaint  Patient presents with  . Lower Back - Pain    05/24/20 went to an urgent care for low back pain and pain down the left leg. NKI. Was given a shot of toradol, prednisone and flexeril. NKI. Got some better. Then had hospital visit for DKA, newly diagnosed with DM. Does still have the back pain with tingling/numbness down the left leg to the foot. 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 Is worse with sitting. The left leg is weaker and feels heavier than the right leg.     HPI: She is here with low back and left leg pain.  Symptoms started a couple months ago, no injury.  She developed pain in the left low back with radiating pain down the leg to the foot.  She went to urgent care and was given Toradol injection and prednisone along with Flexeril.  Her symptoms improved, but then a couple weeks later she was feeling terrible and went to the hospital where she was diagnosed with diabetes and DKA.  She was discharged from the hospital and has continued to have low back and left leg pain although the pain is not as severe.  She does have a constant tingling in her leg down to her toes.  Her leg feels heavy when trying to go upstairs.  Denies fevers or chills, denies bowel or bladder dysfunction.  She has not had a rash.                ROS:   All other systems were reviewed and are negative.  Objective: Vital Signs: There were no vitals taken for this visit.  Physical Exam:  General:  Alert and oriented, in no acute distress. Pulm:  Breathing unlabored. Psy:  Normal mood, congruent affect  Low back: She is tender along the L5-S1 level in the midline.  She has a little bit of tenderness in the left sciatic notch.   Straight leg raise is negative, lower extremities strength and reflexes are normal except for ankle dorsiflexion on the left which is slightly weak, 5 -/5, compared to the right.  Knee and ankle DTRs are 2+.  Imaging: XR Lumbar Spine 2-3 Views  Result Date: 07/09/2020 X-rays lumbar spine reveal moderate to severe L4-5 and L5-S1 degenerative disc disease.  No sign of compression fracture or neoplasm.  Hip joints look normal.   Assessment & Plan: 1.  Left-sided low back pain with radiculopathy, suspicious for disc protrusion. -We will try physical therapy.  If symptoms persist, then MRI scan.     Procedures: No procedures performed        PMFS History: Patient Active Problem List   Diagnosis Date Noted  . DKA (diabetic ketoacidosis) (HCC) 06/09/2020  . Obesity (BMI 30-39.9) 06/09/2020  . Leukocytosis 06/09/2020  . AKI (acute kidney injury) (HCC) 06/09/2020  . Acute metabolic encephalopathy 06/09/2020  . Polycythemia 06/09/2020  . 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 Anal fissure 10/02/2017  . 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 Hidradenitis suppurativa 10/02/2017  . Asthma 02/02/2017   Past Medical History:  Diagnosis Date  . Hidradenitis   . Obesity   .  Sciatic leg pain     Family History  Problem Relation Age of Onset  . Hypertension Mother   . Hyperlipidemia Mother   . Osteoarthritis Mother   . Diabetes Maternal Grandmother     No past surgical history on file. Social History   Occupational History  . Not on file  Tobacco Use  . Smoking status: Former Games developer  . Smokeless tobacco: Never Used  Substance and Sexual Activity  . Alcohol use: Yes    Comment: occas  . Drug use: Never  . Sexual activity: Not on file

## 2020-07-26 ENCOUNTER — Encounter: Payer: Self-pay | Admitting: Internal Medicine

## 2020-07-26 MED ORDER — ONETOUCH VERIO VI STRP: ORAL_STRIP | 1 refills | Status: DC

## 2020-07-26 MED ORDER — ONETOUCH DELICA LANCETS 33G MISC: 1 refills | Status: DC

## 2020-08-02 DIAGNOSIS — M545 Low back pain, unspecified: Secondary | ICD-10-CM | POA: Diagnosis not present

## 2020-08-18 LAB — HM DIABETES EYE EXAM

## 2020-08-20 ENCOUNTER — Encounter: Payer: Self-pay | Admitting: Internal Medicine

## 2020-08-24 DIAGNOSIS — M545 Low back pain, unspecified: Secondary | ICD-10-CM | POA: Diagnosis not present

## 2020-08-31 DIAGNOSIS — M545 Low back pain, unspecified: Secondary | ICD-10-CM | POA: Diagnosis not present

## 2020-09-10 ENCOUNTER — Encounter: Payer: Self-pay | Admitting: Internal Medicine

## 2020-09-24 ENCOUNTER — Ambulatory Visit (INDEPENDENT_AMBULATORY_CARE_PROVIDER_SITE_OTHER): Payer: BC Managed Care – PPO | Admitting: Internal Medicine

## 2020-09-24 ENCOUNTER — Encounter: Payer: Self-pay | Admitting: Internal Medicine

## 2020-09-24 ENCOUNTER — Other Ambulatory Visit: Payer: Self-pay

## 2020-09-24 VITALS — BP 118/84 | HR 64 | Ht 68.0 in | Wt 243.0 lb

## 2020-09-24 DIAGNOSIS — E785 Hyperlipidemia, unspecified: Secondary | ICD-10-CM | POA: Diagnosis not present

## 2020-09-24 DIAGNOSIS — E119 Type 2 diabetes mellitus without complications: Secondary | ICD-10-CM | POA: Insufficient documentation

## 2020-09-24 DIAGNOSIS — E0865 Diabetes mellitus due to underlying condition with hyperglycemia: Secondary | ICD-10-CM

## 2020-09-24 HISTORY — DX: Type 2 diabetes mellitus without complications: E11.9

## 2020-09-24 LAB — POCT GLYCOSYLATED HEMOGLOBIN (HGB A1C): Hemoglobin A1C: 5.6 % (ref 4.0–5.6)

## 2020-09-24 LAB — TSH: TSH: 1.14 u[IU]/mL (ref 0.35–5.50)

## 2020-09-24 LAB — T4, FREE: Free T4: 0.79 ng/dL (ref 0.60–1.60)

## 2020-09-24 MED ORDER — ONETOUCH VERIO VI STRP
1.0000 | ORAL_STRIP | Freq: Four times a day (QID) | 3 refills | Status: DC
Start: 2020-09-24 — End: 2021-01-05

## 2020-09-24 NOTE — Patient Instructions (Addendum)
-   Decrease Tresiba to 26 units daily  - Start using HUmalog per correction scale as below before each meal  - Use the scale below to help guide you:   Blood sugar before meal Number of units to inject  Less than 160 0 unit  161 -  190 1 units  191 -  220 2 units  221 -  250 3 units  251 -  280 4 units  281 -  310 5 units  311 -  340 6 units  341 -  370 7 units    HOW TO TREAT LOW BLOOD SUGARS (Blood sugar LESS THAN 70 MG/DL) Please follow the RULE OF 15 for the treatment of hypoglycemia treatment (when your (blood sugars are less than 70 mg/dL)   STEP 1: Take 15 grams of carbohydrates when your blood sugar is low, which includes:  3-4 GLUCOSE TABS  OR 3-4 OZ OF JUICE OR REGULAR SODA OR ONE TUBE OF GLUCOSE GEL    STEP 2: RECHECK blood sugar in 15 MINUTES STEP 3: If your blood sugar is still low at the 15 minute recheck --> then, go back to STEP 1 and treat AGAIN with another 15 grams of carbohydrates.

## 2020-09-24 NOTE — Progress Notes (Signed)
Name: Whitney Baker  Age/ Sex: 45 y.o., female   MRN/ DOB: [id number], [birth year]     PCP: Girtha Rm, NP-C   Reason for Endocrinology Evaluation: Type 2 Diabetes Mellitus  Initial Endocrine Consultative Visit: 06/21/2020    PATIENT IDENTIFIER: Whitney Baker is a 45 y.o. female with a past medical history of T2DM. The patient has followed with Endocrinology clinic since 06/21/2020 for consultative assistance with management of her diabetes.  DIABETIC HISTORY:  Whitney Baker was diagnosed with DM 05/2020. She presented to the Ed with a serum glucose of 1,434 mg/dL with an AG of 32 and  Bicarb level of 11. She presented with back pains, was on prednisone for approximately 4 days prior to her presentation to the ED  and flexeril., Metformin caused diarrhea . Her hemoglobin A1c has ranged from 5.8% in 09/2020, peaking at >15.5% in 05/2020.   SUBJECTIVE:   During the last visit (06/21/2020): A1c > 15.5 % this was a new diagnosis we continued MDI regimen     Today (09/24/2020): [name] is here for follow-up on diabetes management .She checks her blood sugars. times daily. The patient has not had hypoglycemic episodes since the last clinic visit.  Denies recent nausea or vomiting   HOME DIABETES REGIMEN:  Tresiba 30 units daily Humalog 10 units TIDQAC    Statin: Yes ACE-I/ARB: No Prior Diabetic Education: Yes   METER DOWNLOAD SUMMARY: Date range evaluated: 6/24-09/24/2020  Average Number Tests/Day = 2.4 Overall Mean FS Glucose = 102 Standard Deviation = 17  BG Ranges: Low = 77 High = 144   Hypoglycemic Events/30 Days: BG < 50 = 0 Episodes of symptomatic severe hypoglycemia = 0    DIABETIC COMPLICATIONS: Microvascular complications:  Cataract  Denies: CKD, neuropathy  Last eye exam: Completed 06/2020   Macrovascular complications:   Denies: CAD, PVD, CVA   HISTORY:  Past Medical History:  Past Medical History:  Diagnosis Date    Hidradenitis    Obesity    Sciatic leg pain    Past Surgical History: No past surgical history on file. Social History:  reports that she has quit smoking. She has never used smokeless tobacco. She reports current alcohol use. She reports that she does not use drugs. Family History:  Family History  Problem Relation Age of Onset   Hypertension Mother    Hyperlipidemia Mother    Osteoarthritis Mother    Diabetes Maternal Grandmother      HOME MEDICATIONS: Allergies as of 09/24/2020   No Known Allergies      Medication List        Accurate as of September 24, 2020 10:01 AM. If you have any questions, ask your nurse or doctor.          STOP taking these medications    Norethindrone-Ethinyl Estradiol-Fe Biphas 1 MG-10 MCG / 10 MCG tablet Commonly known as: LO LOESTRIN FE Stopped by: Dorita Sciara, MD       TAKE these medications    atorvastatin 20 MG tablet Commonly known as: LIPITOR Take 1 tablet (20 mg total) by mouth daily.   blood glucose meter kit and supplies Dispense based on patient and insurance preference. Use up to four times daily as directed. (FOR ICD-10 E10.9, E11.9).   cyclobenzaprine 10 MG tablet Commonly known as: FLEXERIL Take 1 tablet (10 mg total) by mouth 3 (three) times daily as needed for muscle spasms. DO NOT DRINK ALCOHOL OR DRIVE WHILE TAKING THIS MEDICATION  Dexcom G6 Receiver Devi Use as instructed to check blood sugar daily   Dexcom G6 Sensor Misc 1 Device by Does not apply route as directed.   Dexcom G6 Transmitter Misc 1 Device by Does not apply route as directed.   insulin lispro 100 UNIT/ML KwikPen Commonly known as: HumaLOG KwikPen Inject 15 Units into the skin 3 (three) times daily. What changed: how much to take   OneTouch Delica Lancets 26J Misc Test twice a day   OneTouch Verio test strip Generic drug: glucose blood Test twice a day   Pen Needles 31G X 6 MM Misc Use to inject insulin (Uses 5 times a day)    Whitney Baker FlexTouch 100 UNIT/ML FlexTouch Pen Generic drug: insulin degludec Inject 30 Units into the skin daily.         OBJECTIVE:   Vital Signs: BP 118/84   Pulse 64   Ht $R'5\' 8"'jX$  (1.727 m)   Wt 243 lb (110.2 kg)   BMI 36.95 kg/m   Wt Readings from Last 3 Encounters:  09/24/20 243 lb (110.2 kg)  06/21/20 258 lb (117 kg)  06/15/20 253 lb 9.6 oz (115 kg)     Exam: General: Pt appears well and is in NAD  Lungs: Clear with good BS bilat with no rales, rhonchi, or wheezes  Heart: RRR with normal S1 and S2 and no gallops; no murmurs; no rub  Abdomen: Normoactive bowel sounds, soft, nontender, without masses or organomegaly palpable  Extremities: No pretibial edema.   Neuro: MS is good with appropriate affect, pt is alert and Ox3      DM foot exam: 06/21/2020   The skin of the feet is intact without sores or ulcerations. The pedal pulses are 2+ on right and 2+ on left. The sensation is intact to a screening 5.07, 10 gram monofilament bilaterally      DATA REVIEWED:  Lab Results  Component Value Date   HGBA1C 5.6 09/24/2020   HGBA1C >15.5 (H) 06/09/2020   Lab Results  Component Value Date   LDLCALC 150 (H) 06/12/2020   CREATININE 0.74 06/15/2020   No results found for: [facility]   Lab Results  Component Value Date   CHOL 225 (H) 06/12/2020   HDL 30 (L) 06/12/2020   LDLCALC 150 (H) 06/12/2020   TRIG 226 (H) 06/12/2020   CHOLHDL 7.5 06/12/2020       Glutamic Acid Decarb Ab <5 IU/mL <5    ISLET CELL ANTIBODY SCREEN NEGATIVE NEGATIVE     ASSESSMENT / PLAN / RECOMMENDATIONS:   1) Type 2 Diabetes Mellitus, Optimally controlled, Without complications - Most recent A1c of 5.6 %. Goal A1c < 7.0 %.     -She has done an amazing job with 15 LB's weight loss as well as A1c at 5.6%, down from> 15.5% -She has made drastic dietary changes and is motivated to continue this, she is requesting a referral to a nutritionist in [place] -I have prescribed a  Dexcom for her which she has received but she is not interested in this technology -She is intolerant to Metformin -We discussed add-on therapy with GLP-1 agonist to help completely wean off insulin, but she is not interested at this time and would prefer to gradually wean off insulin without alternative therapy. -Given recent acidosis, I will stay away from SGLT2 inhibitors at this time -I will reduce her basal insulin dose, I am also going to stop standing dose of Humalog but provide her with correction scale to be used  before each meal   MEDICATIONS: Decrease Tresiba to 26 units daily CF: Humalog (BG -130/30)  EDUCATION / INSTRUCTIONS: BG monitoring instructions: Patient is instructed to check her blood sugars 3 times a day, preprandial. Call [place] [facility] if: BG persistently < 70  I reviewed the Rule of 15 for the treatment of hypoglycemia in detail with the patient. Literature supplied.   2) Diabetic complications:  Eye: Does not have known diabetic retinopathy.  Neuro/ Feet: Does not have known diabetic peripheral neuropathy .  Renal: Patient does not have known baseline CKD.   3) Dyslipidemia:    - LDL hight at 150 mg/dL , Tg 226 mg/dL .  She is on atorvastatin and tolerating well   Continue  Lipitor 20 mg daily    F/U in 3 months   Signed electronically by: Mack Guise, MD  [facility] Endocrinology  [facility] [address] [place], [place] [address] Phone: [phone] FAX: [phone]   CC: Girtha Rm, NP-C [address] [address] Phone: [phone]  Fax: [phone]  Return to Endocrinology clinic as below: No future appointments.

## 2020-10-19 DIAGNOSIS — Z20822 Contact with and (suspected) exposure to covid-19: Secondary | ICD-10-CM | POA: Diagnosis not present

## 2020-10-20 DIAGNOSIS — Z20822 Contact with and (suspected) exposure to covid-19: Secondary | ICD-10-CM | POA: Diagnosis not present

## 2020-12-31 ENCOUNTER — Other Ambulatory Visit: Payer: Self-pay

## 2020-12-31 ENCOUNTER — Ambulatory Visit: Payer: BC Managed Care – PPO | Admitting: Internal Medicine

## 2020-12-31 ENCOUNTER — Encounter: Payer: Self-pay | Admitting: Internal Medicine

## 2020-12-31 ENCOUNTER — Ambulatory Visit (INDEPENDENT_AMBULATORY_CARE_PROVIDER_SITE_OTHER): Payer: BC Managed Care – PPO | Admitting: Internal Medicine

## 2020-12-31 VITALS — BP 124/76 | HR 78 | Ht 68.0 in | Wt 231.0 lb

## 2020-12-31 DIAGNOSIS — E785 Hyperlipidemia, unspecified: Secondary | ICD-10-CM

## 2020-12-31 DIAGNOSIS — E119 Type 2 diabetes mellitus without complications: Secondary | ICD-10-CM

## 2020-12-31 LAB — POCT GLYCOSYLATED HEMOGLOBIN (HGB A1C): Hemoglobin A1C: 6.2 % — AB (ref 4.0–5.6)

## 2020-12-31 LAB — MICROALBUMIN / CREATININE URINE RATIO
Creatinine,U: 163.4 mg/dL
Microalb Creat Ratio: 0.4 mg/g (ref 0.0–30.0)
Microalb, Ur: <0.7 mg/dL (ref 0.0–1.9)

## 2020-12-31 LAB — COMPREHENSIVE METABOLIC PANEL
ALT: 14 U/L (ref 0–35)
AST: 15 U/L (ref 0–37)
Albumin: 4.4 g/dL (ref 3.5–5.2)
Alkaline Phosphatase: 84 U/L (ref 39–117)
BUN: 12 mg/dL (ref 6–23)
CO2: 23 mEq/L (ref 19–32)
Calcium: 9.8 mg/dL (ref 8.4–10.5)
Chloride: 104 mEq/L (ref 96–112)
Creatinine, Ser: 0.75 mg/dL (ref 0.40–1.20)
GFR: 97.37 mL/min (ref >60.00)
Glucose, Bld: 130 mg/dL — ABNORMAL HIGH (ref 70–99)
Potassium: 3.8 mEq/L (ref 3.5–5.1)
Sodium: 135 mEq/L (ref 135–145)
Total Bilirubin: 0.5 mg/dL (ref 0.2–1.2)
Total Protein: 7.3 g/dL (ref 6.0–8.3)

## 2020-12-31 LAB — CBC
HCT: 42.5 % (ref 36.0–46.0)
Hemoglobin: 13.6 g/dL (ref 12.0–15.0)
MCHC: 32.1 g/dL (ref 30.0–36.0)
MCV: 84.5 fl (ref 78.0–100.0)
Platelets: 355 10*3/uL (ref 150.0–400.0)
RBC: 5.03 Mil/uL (ref 3.87–5.11)
RDW: 13.7 % (ref 11.5–15.5)
WBC: 9.3 10*3/uL (ref 4.0–10.5)

## 2020-12-31 LAB — LIPID PANEL
Cholesterol: 240 mg/dL — ABNORMAL HIGH (ref 0–200)
HDL: 35.9 mg/dL — ABNORMAL LOW (ref >39.00)
LDL Cholesterol: 178 mg/dL — ABNORMAL HIGH (ref 0–99)
NonHDL: 203.82
Total CHOL/HDL Ratio: 7
Triglycerides: 127 mg/dL (ref 0.0–149.0)
VLDL: 25.4 mg/dL (ref 0.0–40.0)

## 2020-12-31 MED ORDER — ONETOUCH DELICA LANCETS 33G MISC: 1.0000 | Freq: Every day | 3 refills | Status: AC

## 2020-12-31 NOTE — Progress Notes (Signed)
Name: Whitney Baker  Age/ Sex: 45 y.o., female   MRN/ DOB: [id number], 11/13/1975     PCP: Girtha Rm, PA-C   Reason for Endocrinology Evaluation: Type 2 Diabetes Mellitus  Initial Endocrine Consultative Visit: 06/21/2020    PATIENT IDENTIFIER: [name] is a 45 y.o. female with a past medical history of T2DM. The patient has followed with Endocrinology clinic since 06/21/2020 for consultative assistance with management of her diabetes.  DIABETIC HISTORY:  Whitney Baker was diagnosed with DM 05/2020. She presented to the Ed with a serum glucose of 1,434 mg/dL with an AG of 32 and  Bicarb level of 11. She presented with back pains, was on prednisone for approximately 4 days prior to her presentation to the ED  and flexeril., Metformin caused diarrhea . Her hemoglobin A1c has ranged from 5.8% in 09/2020, peaking at >15.5% in 05/2020.   SUBJECTIVE:   During the last visit (09/24/2020): A1c 5.6 % we adjusted MDI regimen per her request      Today (12/31/2020): Whitney Baker is here for follow-up on diabetes management .She checks her blood sugars occasionally . The patient has not had hypoglycemic episodes since the last clinic visit.  Denies recent nausea, vomiting or diarrhea  Continues with weight loss    HOME DIABETES REGIMEN:  Tresiba 26 units daily- not taking  CF: Humalog (Bg-130/30)- not taking     Statin: Yes ACE-I/ARB: No Prior Diabetic Education: Yes   METER DOWNLOAD SUMMARY: Date range evaluated: 9/30-10/14/2022  Average Number Tests/Day = 0.4 Overall Mean FS Glucose = 136 Standard Deviation = 15  BG Ranges: Low = 123 High = 164   Hypoglycemic Events/30 Days: BG < 50 = 0 Episodes of symptomatic severe hypoglycemia = 0    DIABETIC COMPLICATIONS: Microvascular complications:  Cataract  Denies: CKD, neuropathy  Last eye exam: Completed 06/2020   Macrovascular complications:   Denies: CAD, PVD, CVA   HISTORY:  Past Medical  History:  Past Medical History:  Diagnosis Date   Hidradenitis    Obesity    Sciatic leg pain    Past Surgical History: No past surgical history on file. Social History:  reports that she has quit smoking. She has never used smokeless tobacco. She reports current alcohol use. She reports that she does not use drugs. Family History:  Family History  Problem Relation Age of Onset   Hypertension Mother    Hyperlipidemia Mother    Osteoarthritis Mother    Diabetes Maternal Grandmother      HOME MEDICATIONS: Allergies as of 12/31/2020   No Known Allergies      Medication List        Accurate as of December 31, 2020  9:21 AM. If you have any questions, ask your nurse or doctor.          STOP taking these medications    cyclobenzaprine 10 MG tablet Commonly known as: FLEXERIL Stopped by: Dorita Sciara, MD       TAKE these medications    atorvastatin 20 MG tablet Commonly known as: LIPITOR Take 1 tablet (20 mg total) by mouth daily.   blood glucose meter kit and supplies Dispense based on patient and insurance preference. Use up to four times daily as directed. (FOR ICD-10 E10.9, E11.9).   Dexcom G6 Receiver Devi Use as instructed to check blood sugar daily   Dexcom G6 Sensor Misc 1 Device by Does not apply route as directed.   Dexcom G6 Transmitter Misc  1 Device by Does not apply route as directed.   insulin lispro 100 UNIT/ML KwikPen Commonly known as: HumaLOG KwikPen Inject 15 Units into the skin 3 (three) times daily.   OneTouch Delica Lancets 97X Misc Test twice a day   OneTouch Verio test strip Generic drug: glucose blood 1 each by Other route in the morning, at noon, in the evening, and at bedtime. Use as instructed   Pen Needles 31G X 6 MM Misc Use to inject insulin (Uses 5 times a day)   Tyler Aas FlexTouch 100 UNIT/ML FlexTouch Pen Generic drug: insulin degludec Inject 30 Units into the skin daily.         OBJECTIVE:   Vital  Signs: BP 124/76 (BP Location: Left Arm, Patient Position: Sitting, Cuff Size: Small)   Pulse 78   Ht $R'5\' 8"'Kv$  (1.727 m)   Wt 231 lb (104.8 kg)   BMI 35.12 kg/m   Wt Readings from Last 3 Encounters:  12/31/20 231 lb (104.8 kg)  09/24/20 243 lb (110.2 kg)  06/21/20 258 lb (117 kg)     Exam: General: Pt appears well and is in NAD  Lungs: Clear with good BS bilat with no rales, rhonchi, or wheezes  Heart: RRR with normal S1 and S2 and no gallops; no murmurs; no rub  Abdomen: Normoactive bowel sounds, soft, nontender  Extremities: No pretibial edema.   Neuro: MS is good with appropriate affect, pt is alert and Ox3      DM foot exam: 06/21/2020   The skin of the feet is intact without sores or ulcerations. The pedal pulses are 2+ on right and 2+ on left. The sensation is intact to a screening 5.07, 10 gram monofilament bilaterally      DATA REVIEWED:  Lab Results  Component Value Date   HGBA1C 6.2 (A) 12/31/2020   HGBA1C 5.6 09/24/2020   HGBA1C >15.5 (H) 06/09/2020   Results for Whitney Baker, Whitney Baker (MRN [id number]) as of 12/31/2020 16:11  Ref. Range 12/31/2020 09:51  Sodium Latest Ref Range: 135 - 145 mEq/L 135  Potassium Latest Ref Range: 3.5 - 5.1 mEq/L 3.8  Chloride Latest Ref Range: 96 - 112 mEq/L 104  CO2 Latest Ref Range: 19 - 32 mEq/L 23  Glucose Latest Ref Range: 70 - 99 mg/dL 130 (H)  BUN Latest Ref Range: 6 - 23 mg/dL 12  Creatinine Latest Ref Range: 0.40 - 1.20 mg/dL 0.75  Calcium Latest Ref Range: 8.4 - 10.5 mg/dL 9.8  Alkaline Phosphatase Latest Ref Range: 39 - 117 U/L 84  Albumin Latest Ref Range: 3.5 - 5.2 g/dL 4.4  AST Latest Ref Range: 0 - 37 U/L 15  ALT Latest Ref Range: 0 - 35 U/L 14  Total Protein Latest Ref Range: 6.0 - 8.3 g/dL 7.3  Total Bilirubin Latest Ref Range: 0.2 - 1.2 mg/dL 0.5  GFR Latest Ref Range: >60.00 mL/min 97.37  Total CHOL/HDL Ratio Unknown 7  Cholesterol Latest Ref Range: 0 - 200 mg/dL 240 (H)  HDL Cholesterol Latest Ref Range:  >39.00 mg/dL 35.90 (L)  LDL (calc) Latest Ref Range: 0 - 99 mg/dL 178 (H)  MICROALB/CREAT RATIO Latest Ref Range: 0.0 - 30.0 mg/g 0.4  NonHDL Unknown 203.82  Triglycerides Latest Ref Range: 0.0 - 149.0 mg/dL 127.0  VLDL Latest Ref Range: 0.0 - 40.0 mg/dL 25.4  WBC Latest Ref Range: 4.0 - 10.5 K/uL 9.3  RBC Latest Ref Range: 3.87 - 5.11 Mil/uL 5.03  Hemoglobin Latest Ref Range: 12.0 - 15.0 g/dL  13.6  HCT Latest Ref Range: 36.0 - 46.0 % 42.5  MCV Latest Ref Range: 78.0 - 100.0 fl 84.5  MCHC Latest Ref Range: 30.0 - 36.0 g/dL 32.1  RDW Latest Ref Range: 11.5 - 15.5 % 13.7  Platelets Latest Ref Range: 150.0 - 400.0 K/uL 355.0  Creatinine,U Latest Units: mg/dL 163.4  Microalb, Ur Latest Ref Range: 0.0 - 1.9 mg/dL <0.7   Glutamic Acid Decarb Ab <5 IU/mL <5    ISLET CELL ANTIBODY SCREEN NEGATIVE NEGATIVE     ASSESSMENT / PLAN / RECOMMENDATIONS:   1) Type 2 Diabetes Mellitus, Optimally controlled, Without complications - Most recent A1c of 6.2 %. Goal A1c < 7.0 %.     -I have praised the patient on optimizing glycemic control despite self discontinuing insulin a few months now -I have also congratulated her on continued weight loss and following a healthy lifestyle -She will remain off all medications at this time   MEDICATIONS: N/A  EDUCATION / INSTRUCTIONS: BG monitoring instructions: Patient is instructed to check her blood sugars 3 times a day, preprandial. Call [facility] [place] [facility] if: BG persistently < 70  I reviewed the Rule of 15 for the treatment of hypoglycemia in detail with the patient. Literature supplied.   2) Diabetic complications:  Eye: Does not have known diabetic retinopathy.  Neuro/ Feet: Does not have known diabetic peripheral neuropathy .  Renal: Patient does not have known baseline CKD.   3) Dyslipidemia:    - LDL hight at 150 mg/dL , Tg 226 mg/dL .  She is on atorvastatin and tolerating well  - She is hesitant in starting statin therapy -  Discussed cardiovascular benefits of statins, she has not started Lipitor and is not keen on this -We discussed LDL goal<70 mg/DL    F/U in 6 months   Signed electronically by: Mack Guise, MD  [facility] Endocrinology  [facility] [address] [place], [place] [address] Phone: [phone] FAX: [phone]   CC: Davy Pique [address] [address] Phone: [phone]  Fax: [phone]  Return to Endocrinology clinic as below: No future appointments.

## 2020-12-31 NOTE — Patient Instructions (Signed)
Keep Up the Good Work ! 

## 2021-01-05 ENCOUNTER — Other Ambulatory Visit: Payer: Self-pay | Admitting: Internal Medicine

## 2021-01-05 DIAGNOSIS — E119 Type 2 diabetes mellitus without complications: Secondary | ICD-10-CM

## 2021-01-10 NOTE — Progress Notes (Deleted)
Error

## 2021-01-14 NOTE — Progress Notes (Deleted)
Error

## 2021-01-25 NOTE — Progress Notes (Deleted)
N/A  Pt did not show up

## 2021-01-26 ENCOUNTER — Other Ambulatory Visit: Payer: Self-pay

## 2021-01-26 ENCOUNTER — Ambulatory Visit (INDEPENDENT_AMBULATORY_CARE_PROVIDER_SITE_OTHER): Payer: BC Managed Care – PPO | Admitting: Medical

## 2021-01-26 ENCOUNTER — Encounter: Payer: Self-pay | Admitting: Medical

## 2021-01-26 VITALS — BP 138/98 | Wt 236.2 lb

## 2021-01-26 DIAGNOSIS — E119 Type 2 diabetes mellitus without complications: Secondary | ICD-10-CM

## 2021-01-26 DIAGNOSIS — M79605 Pain in left leg: Secondary | ICD-10-CM | POA: Diagnosis not present

## 2021-01-26 DIAGNOSIS — G8929 Other chronic pain: Secondary | ICD-10-CM

## 2021-01-26 DIAGNOSIS — M5442 Lumbago with sciatica, left side: Secondary | ICD-10-CM | POA: Diagnosis not present

## 2021-01-26 DIAGNOSIS — G5702 Lesion of sciatic nerve, left lower limb: Secondary | ICD-10-CM | POA: Diagnosis not present

## 2021-01-26 DIAGNOSIS — M62838 Other muscle spasm: Secondary | ICD-10-CM

## 2021-01-26 MED ORDER — METHOCARBAMOL 500 MG PO TABS: 500.0000 mg | ORAL_TABLET | Freq: Two times a day (BID) | ORAL | 0 refills | Status: DC | PRN

## 2021-01-26 MED ORDER — HYDROCODONE-ACETAMINOPHEN 5-325 MG PO TABS: 1.0000 | ORAL_TABLET | Freq: Two times a day (BID) | ORAL | 0 refills | Status: DC | PRN

## 2021-01-26 MED ORDER — NAPROXEN 500 MG PO TABS: 500.0000 mg | ORAL_TABLET | Freq: Two times a day (BID) | ORAL | 0 refills | Status: DC

## 2021-01-26 NOTE — Progress Notes (Signed)
Subjective:  Whitney Baker is a 45 y.o. female who presents for Chief Complaint  Patient presents with   Back Pain    Radiates down left leg- PT informed her to see PCP about this because pain is not easing up, has tightness pain in left leg, ibuprofen no longer effective.  Sees orthopedic specialist he referred to PT her last visit with him was a month ago.   Medical Team:   Dr. Lavada Mesi, orthopedics Dr. Terrace Arabia, endocrinology Henreitta Leber, [place], gynecology She was seeing Hetty Blend, nurse practitioner here before she left recently   Here for sciatica.  She first had a problem with this earlier in the year in the spring.  Saw urgent care.  Diagnosis sciatica.  She was referred to orthopedics.  She had a back x-ray.  She actually was prescribed pain medicine, muscle laxer, prednisone earlier in the year for the same.  She ended up doing several months of physical therapy.  However she is continued to have similar pains on and off this year and no complete resolution.  She has a bad flareup currently for the last several days.  She has pain in her low back left buttock left leg.  Pain radiates down the leg to the feet.  She does note tingling in the leg.  She does feel weaker in the left leg.  She is having to stand with her left leg out to the side just to bend down.  The pain is quite unbearable.  No fever, no saddle anesthesia, no incontinence.  Of note she was diagnosed with diabetes earlier in the year.  Was hospitalized for DKA.  She is worked hard to get her sugars under control and doing much better now.  No other aggravating or relieving factors.    No other c/o.  The following portions of the patient's history were reviewed and updated as appropriate: allergies, current medications, past family history, past medical history, past social history, past surgical history and problem list.  ROS Otherwise as in subjective above  Objective: BP (!) 138/98 (BP  Location: Right Arm)   Wt 236 lb 3.2 oz (107.1 kg)   BMI 35.91 kg/m   General appearance: alert, no distress, well developed, well nourished, in pain Abdomen: +bs, soft, non tender, non distended, no masses, no hepatomegaly, no splenomegaly Tender left low back, range of motion about 50% of normal due to pain, no swelling or other deformity Spasm and tightness of left hamstring, tender in the left thigh in general, otherwise leg nontender to palpation.  Hip range of motion okay and nontender.  Lower leg nontender and no deformity Pulses: 2+ radial pulses, 2+ pedal pulses, normal cap refill Ext: no edema Neuro: There is weakness in the left leg compared to the right but still 4-5/5 strength.  The weakness is mainly in the lower left leg.  DTR a little blunted on the left leg.  Sensation normal bilateral legs.  Unable to heel and toe walk on the left.  Lumbar spine 07/08/2018 X-rays lumbar spine reveal moderate to severe L4-5 and L5-S1 degenerative  disc disease.  No sign of compression fracture or neoplasm.  Hip joints  look normal.   Assessment: Encounter Diagnoses  Name Primary?   Sciatic neuropathy, left Yes   Chronic left-sided low back pain with left-sided sciatica    Left leg pain    Type 2 diabetes mellitus without complication, without long-term current use of insulin (HCC)    Muscle spasm  Plan: We discussed her symptoms and concerns, current level of pain.  I reviewed her prior orthopedic notes and x-ray of her lumbar spine from earlier this year.  We will likely might need to add gabapentin as well.  For now medications as below and instructions as below discussed with patient and printed for patient.  We will set up for MRI lumbar spine.  If worse in the short-term such as severe symptoms, fever, incontinence, saddle anesthesia or other then go to the hospital.  Otherwise follow-up pending MRI  I reviewed her chart records from earlier in the year when she was  hospitalized for DKA.  She seems to be doing well with her diabetes and recent.  Seeing endocrinology  Patient Instructions   Encounter Diagnoses  Name Primary?   Sciatic neuropathy, left Yes   Chronic left-sided low back pain with left-sided sciatica    Left leg pain    Type 2 diabetes mellitus without complication, without long-term current use of insulin (HCC)    Muscle spasm      Your symptoms and exam findings suggest sciatica, low back pain, radicular pain and muscle spasm  Recommendations: Over the next few days you can use Hydrocodone pain medicine 1 tablet twice a day as needed.  Caution this can make you sleepy Over the next few days you can use Robaxin muscle relaxer.  This can also be used twice daily as needed for muscle spasm in the back and leg.  This can also potentially make you sleepy.  Try not to take this at the same time as the hydrocodone.  Spread them out by at least 3 or 4 hours Begin Naprosyn twice daily with food over the next week.  This is for pain and inflammation We will go ahead and schedule you for an MRI of the lumbar spine to further evaluate.  This is to help evaluate for bulging disc or ruptured disc or other cause of worsening back pain. Over the next few days rest.  As the pain subsides you can do some gentle stretching Use a pillow or several pillows under your legs in the bed to ease up some of the tension on the back and leg Tonight when you get home, take 1 Robaxin and 1 Naprosyn.  A few hours later at bedtime you can use 1 hydrocodone.  Otherwise follow the instructions above     Whitney Baker was seen today for back pain.  Diagnoses and all orders for this visit:  Sciatic neuropathy, left -     MR Lumbar Spine Wo Contrast; Future  Chronic left-sided low back pain with left-sided sciatica -     MR Lumbar Spine Wo Contrast; Future  Left leg pain -     MR Lumbar Spine Wo Contrast; Future  Type 2 diabetes mellitus without complication, without  long-term current use of insulin (HCC)  Muscle spasm  Other orders -     HYDROcodone-acetaminophen (NORCO) 5-325 MG tablet; Take 1 tablet by mouth 2 (two) times daily as needed. -     naproxen (NAPROSYN) 500 MG tablet; Take 1 tablet (500 mg total) by mouth 2 (two) times daily with a meal. -     methocarbamol (ROBAXIN) 500 MG tablet; Take 1 tablet (500 mg total) by mouth 2 (two) times daily as needed for muscle spasms.   Follow up: Pending MRI

## 2021-01-26 NOTE — Patient Instructions (Addendum)
Encounter Diagnoses  Name Primary?   Sciatic neuropathy, left Yes   Chronic left-sided low back pain with left-sided sciatica    Left leg pain    Type 2 diabetes mellitus without complication, without long-term current use of insulin (HCC)    Muscle spasm      Your symptoms and exam findings suggest sciatica, low back pain, radicular pain and muscle spasm  Recommendations: Over the next few days you can use Hydrocodone pain medicine 1 tablet twice a day as needed.  Caution this can make you sleepy Over the next few days you can use Robaxin muscle relaxer.  This can also be used twice daily as needed for muscle spasm in the back and leg.  This can also potentially make you sleepy.  Try not to take this at the same time as the hydrocodone.  Spread them out by at least 3 or 4 hours Begin Naprosyn twice daily with food over the next week.  This is for pain and inflammation We will go ahead and schedule you for an MRI of the lumbar spine to further evaluate.  This is to help evaluate for bulging disc or ruptured disc or other cause of worsening back pain. Over the next few days rest.  As the pain subsides you can do some gentle stretching Use a pillow or several pillows under your legs in the bed to ease up some of the tension on the back and leg Tonight when you get home, take 1 Robaxin and 1 Naprosyn.  A few hours later at bedtime you can use 1 hydrocodone.  Otherwise follow the instructions above

## 2021-01-31 ENCOUNTER — Telehealth: Payer: Self-pay | Admitting: Family Medicine

## 2021-01-31 NOTE — Telephone Encounter (Signed)
Please call  spot on back of thigh "knot" not relaxing /tight   She is wondering if there is a different muscle relaxer that would help more, med you gave only seems to work maybe about an hour and she has to be lying down flat   She was already advised to give radiology more time to get back to her about MRI

## 2021-01-31 NOTE — Telephone Encounter (Signed)
Left message for pt to call me back 

## 2021-01-31 NOTE — Telephone Encounter (Signed)
Pt. Called back to let you know that Imaging called her and she is scheduled on 02/18/21 that was the first available.

## 2021-02-01 NOTE — Telephone Encounter (Signed)
Pt was notified.  

## 2021-02-01 NOTE — Telephone Encounter (Signed)
Pt was notified. She wants to know does the robaxin make you sleepy as she feels she can only take in the evening as she is sleepy

## 2021-02-04 ENCOUNTER — Other Ambulatory Visit: Payer: Self-pay | Admitting: Medical

## 2021-02-04 MED ORDER — HYDROCODONE-ACETAMINOPHEN 5-325 MG PO TABS: 1.0000 | ORAL_TABLET | Freq: Two times a day (BID) | ORAL | 0 refills | Status: DC | PRN

## 2021-02-04 MED ORDER — TIZANIDINE HCL 4 MG PO TABS: 4.0000 mg | ORAL_TABLET | Freq: Two times a day (BID) | ORAL | 0 refills | Status: DC | PRN

## 2021-02-04 NOTE — Telephone Encounter (Signed)
Pt called back and states that she doesn't feel the robaxin is helping at all and would like something else sent in stronger. She would also like a refill on hydrocodone as she is out as well. Her MRI is not until the 15th and she is trying to get in on 12/2 but still waiting to hear back.

## 2021-02-06 ENCOUNTER — Other Ambulatory Visit: Payer: Self-pay | Admitting: Medical

## 2021-02-07 ENCOUNTER — Other Ambulatory Visit: Payer: Self-pay | Admitting: Medical

## 2021-02-07 MED ORDER — NAPROXEN 500 MG PO TABS: 500.0000 mg | ORAL_TABLET | Freq: Two times a day (BID) | ORAL | 0 refills | Status: DC

## 2021-02-11 ENCOUNTER — Other Ambulatory Visit: Payer: Self-pay | Admitting: Medical

## 2021-02-17 ENCOUNTER — Telehealth: Payer: Self-pay

## 2021-02-17 ENCOUNTER — Other Ambulatory Visit: Payer: Self-pay | Admitting: Medical

## 2021-02-17 MED ORDER — BACLOFEN 10 MG PO TABS: 10.0000 mg | ORAL_TABLET | Freq: Two times a day (BID) | ORAL | 0 refills | Status: DC | PRN

## 2021-02-17 NOTE — Progress Notes (Signed)
bacl

## 2021-02-17 NOTE — Telephone Encounter (Signed)
Pt. Called stating she saw you recently and had some questions about her medication you had prescribed her for her pain. You prescribed her Hydrocodone, naproxen, and tizanidine. She said she quit taking the hydrocodone because it stopped her up and she couldn't use the bathroom for a few days. When she was at her moms house for thanksgiving she tried baclofen her mom had because she left her tizanidine at home and that seemed to work better than the Tizanidine. So she wants to know if she could take Ibuprofen with the Naproxen and Baclofen instead of the hydrocodone. She also wanted to know if she could get some baclofen instead of the tizanidine since it is working better. She said her pain level is about 5-6 starts with a dull pain then changes to a sharp pain.

## 2021-02-18 ENCOUNTER — Ambulatory Visit
Admission: RE | Admit: 2021-02-18 | Discharge: 2021-02-18 | Disposition: A | Discharge status: Home / Self Care | Payer: BC Managed Care – PPO | Source: Ambulatory Visit | Attending: Medical | Admitting: Medical

## 2021-02-18 ENCOUNTER — Other Ambulatory Visit: Payer: Self-pay

## 2021-02-18 DIAGNOSIS — G8929 Other chronic pain: Secondary | ICD-10-CM

## 2021-02-18 DIAGNOSIS — M48061 Spinal stenosis, lumbar region without neurogenic claudication: Secondary | ICD-10-CM | POA: Diagnosis not present

## 2021-02-18 DIAGNOSIS — G5702 Lesion of sciatic nerve, left lower limb: Secondary | ICD-10-CM

## 2021-02-18 DIAGNOSIS — M545 Low back pain, unspecified: Secondary | ICD-10-CM | POA: Diagnosis not present

## 2021-02-18 DIAGNOSIS — M79605 Pain in left leg: Secondary | ICD-10-CM

## 2021-02-18 DIAGNOSIS — M5442 Lumbago with sciatica, left side: Secondary | ICD-10-CM

## 2021-02-18 DIAGNOSIS — R200 Anesthesia of skin: Secondary | ICD-10-CM | POA: Diagnosis not present

## 2021-02-18 DIAGNOSIS — M2578 Osteophyte, vertebrae: Secondary | ICD-10-CM | POA: Diagnosis not present

## 2021-02-21 ENCOUNTER — Ambulatory Visit (INDEPENDENT_AMBULATORY_CARE_PROVIDER_SITE_OTHER): Payer: BC Managed Care – PPO | Admitting: Medical

## 2021-02-21 ENCOUNTER — Other Ambulatory Visit: Payer: Self-pay

## 2021-02-21 VITALS — BP 120/80 | HR 80 | Wt 232.0 lb

## 2021-02-21 DIAGNOSIS — M5126 Other intervertebral disc displacement, lumbar region: Secondary | ICD-10-CM | POA: Diagnosis not present

## 2021-02-21 DIAGNOSIS — M5416 Radiculopathy, lumbar region: Secondary | ICD-10-CM

## 2021-02-21 DIAGNOSIS — M5136 Other intervertebral disc degeneration, lumbar region: Secondary | ICD-10-CM | POA: Diagnosis not present

## 2021-02-21 MED ORDER — POLYETHYLENE GLYCOL 3350 17 GM/SCOOP PO POWD: 17.0000 g | Freq: Every day | ORAL | 0 refills | Status: AC | PRN

## 2021-02-21 MED ORDER — NAPROXEN 500 MG PO TABS: 500.0000 mg | ORAL_TABLET | Freq: Two times a day (BID) | ORAL | 0 refills | Status: DC

## 2021-02-21 MED ORDER — GABAPENTIN 100 MG PO CAPS: 200.0000 mg | ORAL_CAPSULE | Freq: Every day | ORAL | 0 refills | Status: DC

## 2021-02-21 NOTE — Progress Notes (Signed)
Subjective:  Whitney Baker is a 45 y.o. female who presents for Chief Complaint  Patient presents with   Follow-up    Follow-up to go over MRI results   Medical Team:   Dr. Lavada Mesi, orthopedics Dr. Terrace Arabia, endocrinology Henreitta Leber, [place], gynecology She was seeing Hetty Blend, nurse practitioner here before she left recently  Here to discuss MRI results.  I saw her 01/22/2021 for worsening back pain.  She notes completing 3 months of physical therapy within the past several months for the same issue.  This was referred by Dr. Prince Rome orthopedic  Since last visit we talked her on the following changed her muscle relaxer to baclofen which is working better.  She continues on the Naprosyn but would like something instead of hydrocodone at nighttime.  Hydrocodone made her constipated.  Her symptoms are unchanged.  She still is doing a lot of left thigh and leg pain and back pain  When she came in last visit she noted problems with sciatica and back pain and leg pain starting earlier in the year.  She has continued to have similar pains on and off this year and no complete resolution.  She has a bad flareup currently for the last several days.  She has pain in her low back left buttock left leg.  Pain radiates down the leg to the feet.  She does note tingling in the leg.  She does feel weaker in the left leg.  She is having to stand with her left leg out to the side just to bend down.  The pain is quite unbearable.  No fever, no saddle anesthesia, no incontinence.  Of note she was diagnosed with diabetes earlier in the year.  Was hospitalized for DKA.  She is worked hard to get her sugars under control and doing much better now.  No other aggravating or relieving factors.    No other c/o.  The following portions of the patient's history were reviewed and updated as appropriate: allergies, current medications, past family history, past medical history, past social history,  past surgical history and problem list.  ROS Otherwise as in subjective above  Objective: BP 120/80   Pulse 80   Wt 232 lb (105.2 kg)   BMI 35.28 kg/m   General appearance: alert, no distress, well developed, well nourished, in pain Abdomen: +bs, soft, non tender, non distended, no masses, no hepatomegaly, no splenomegaly Tender left low back, range of motion about 50% of normal due to pain, no swelling or other deformity Spasm and tightness of left hamstring, tender in the left thigh in general, otherwise leg nontender to palpation.  Hip range of motion okay and nontender.  Lower leg nontender and no deformity Pulses: 2+ radial pulses, 2+ pedal pulses, normal cap refill Ext: no edema Neuro: There is weakness in the left leg compared to the right but still 4-5/5 strength.  The weakness is mainly in the lower left leg.  DTR a little blunted on the left leg.  Sensation normal bilateral legs.  Unable to heel and toe walk on the left.  Lumbar spine 07/08/2018 X-rays lumbar spine reveal moderate to severe L4-5 and L5-S1 degenerative  disc disease.  No sign of compression fracture or neoplasm.  Hip joints  look normal.   MR spine lumbar without contrast 02/18/21 IMPRESSION: 1. Symptomatic level may be L5-S1, where a small disc herniation into the left lateral recess results in mild to moderate stenosis there. Query left S1 radiculitis.  2. Chronic severe disc and endplate degeneration at L4-L5, but eccentric to the right. Up to moderate right lateral recess and bilateral foraminal stenosis. 3. Up to mild spinal stenosis at L3-L4 related to disc bulging and facet degeneration.     Assessment: Encounter Diagnoses  Name Primary?   DDD (degenerative disc disease), lumbar Yes   Lumbar herniated disc    Lumbar radiculopathy       Plan: We reviewed the MRI lumbar spine from 02/18/2021 Continue baclofen twice a day, Naprosyn daily in the morning but add gabapentin in the evening.   Discussed risk and benefits and proper use of this medication. MiraLAX as needed for constipation which is mainly from the hydrocodone which she has discontinued. Referral to spine specialist/orthopedist  Gayla was seen today for follow-up.  Diagnoses and all orders for this visit:  DDD (degenerative disc disease), lumbar -     Ambulatory referral to Orthopedic Surgery  Lumbar herniated disc -     Ambulatory referral to Orthopedic Surgery  Lumbar radiculopathy -     Ambulatory referral to Orthopedic Surgery  Other orders -     polyethylene glycol powder (GLYCOLAX/MIRALAX) 17 GM/SCOOP powder; Take 17 g by mouth daily as needed. -     naproxen (NAPROSYN) 500 MG tablet; Take 1 tablet (500 mg total) by mouth 2 (two) times daily with a meal. -     gabapentin (NEURONTIN) 100 MG capsule; Take 2 capsules (200 mg total) by mouth at bedtime.   Follow up: pending referral

## 2021-02-22 ENCOUNTER — Encounter: Payer: Self-pay | Admitting: Medical

## 2021-02-22 ENCOUNTER — Telehealth: Payer: Self-pay | Admitting: Family Medicine

## 2021-02-22 NOTE — Telephone Encounter (Signed)
Pt was seen yesterday and said she was told to call back for a out of work note. She called and wants to see if she can get a note to be out of work for a few days. Is this ok?

## 2021-02-22 NOTE — Telephone Encounter (Signed)
Spoke to pt she said she did reach out to HR about her FMLA paperwork and she has an appt on Friday with the specialist about her back. Note written and emailed per pts request.

## 2021-02-25 DIAGNOSIS — M5416 Radiculopathy, lumbar region: Secondary | ICD-10-CM | POA: Diagnosis not present

## 2021-02-27 ENCOUNTER — Other Ambulatory Visit: Payer: Self-pay | Admitting: Medical

## 2021-02-28 ENCOUNTER — Other Ambulatory Visit: Payer: Self-pay | Admitting: Orthopedic Surgery

## 2021-02-28 DIAGNOSIS — M533 Sacrococcygeal disorders, not elsewhere classified: Secondary | ICD-10-CM

## 2021-02-28 NOTE — Telephone Encounter (Signed)
Pt would like a month supply on this with refills but if she sees ortho then she needs to get it from them correct?

## 2021-03-07 DIAGNOSIS — M5416 Radiculopathy, lumbar region: Secondary | ICD-10-CM | POA: Diagnosis not present

## 2021-03-09 ENCOUNTER — Other Ambulatory Visit: Payer: Self-pay | Admitting: Medical

## 2021-03-16 ENCOUNTER — Other Ambulatory Visit: Payer: Self-pay | Admitting: Medical

## 2021-03-16 ENCOUNTER — Telehealth: Payer: Self-pay | Admitting: Family Medicine

## 2021-03-16 MED ORDER — BACLOFEN 10 MG PO TABS: ORAL_TABLET | ORAL | 0 refills | Status: DC

## 2021-03-16 MED ORDER — NAPROXEN 500 MG PO TABS: 500.0000 mg | ORAL_TABLET | Freq: Two times a day (BID) | ORAL | 0 refills | Status: DC

## 2021-03-16 NOTE — Telephone Encounter (Signed)
Pt called and is requesting refill on her baclofen and her naproxen, she is wanting a month supply and would like to know why she keeps having to call for refills every couple of weeks,  Pt uses [id number] - [place], [place] - [address]

## 2021-03-16 NOTE — Telephone Encounter (Signed)
Spoke to patient she is getting cortisone shot on 04/06/20. Is getting Gabapentin 300 mg from ortho T[facility] and instructed her to continue getting naproxen from [facility]

## 2021-03-28 ENCOUNTER — Other Ambulatory Visit: Payer: Self-pay | Admitting: Medical

## 2021-03-28 MED ORDER — BACLOFEN 10 MG PO TABS: 10.0000 mg | ORAL_TABLET | Freq: Two times a day (BID) | ORAL | 1 refills | Status: DC | PRN

## 2021-03-28 MED ORDER — NAPROXEN 500 MG PO TABS: 500.0000 mg | ORAL_TABLET | Freq: Two times a day (BID) | ORAL | 0 refills | Status: DC

## 2021-04-06 ENCOUNTER — Other Ambulatory Visit: Payer: Self-pay | Admitting: Medical

## 2021-04-07 ENCOUNTER — Telehealth: Payer: Self-pay

## 2021-04-07 DIAGNOSIS — M5126 Other intervertebral disc displacement, lumbar region: Secondary | ICD-10-CM

## 2021-04-07 DIAGNOSIS — M5136 Other intervertebral disc degeneration, lumbar region: Secondary | ICD-10-CM

## 2021-04-07 DIAGNOSIS — M5416 Radiculopathy, lumbar region: Secondary | ICD-10-CM

## 2021-04-07 NOTE — Telephone Encounter (Signed)
Pt states she had a referral to [facility] for her cortisone shots went there and was $750 with insurance and they don't have payment plans and needs referral to [facility] instead because they have payment plans

## 2021-04-08 NOTE — Telephone Encounter (Signed)
Called [facility] and they will file insurance first and whatever they don't pay then patient will be responsible. I have gone ahead and put in referral

## 2021-04-22 ENCOUNTER — Telehealth: Payer: Self-pay

## 2021-04-22 ENCOUNTER — Other Ambulatory Visit: Payer: Self-pay | Admitting: Medical

## 2021-04-22 MED ORDER — BACLOFEN 10 MG PO TABS: 10.0000 mg | ORAL_TABLET | Freq: Two times a day (BID) | ORAL | 0 refills | Status: DC | PRN

## 2021-04-22 NOTE — Telephone Encounter (Signed)
Pt. Aware that you refilled the med and that she should not be doubling up on it. She is also aware to call back next week to schedule a f/u apt.

## 2021-04-22 NOTE — Telephone Encounter (Signed)
Pt. Called wanting to know if she could get a refill on her baclofen 10 mg. She said has been taking 20 mg instead of 10mg  that why she ran out faster because the 10 mg wasn't helping but at 20 mg it is helping. She said she was going to call back next week to scheudle a f/u with you but wanted to know if she could get a refill on the baclofen for now.

## 2021-05-01 ENCOUNTER — Other Ambulatory Visit: Payer: Self-pay | Admitting: Medical

## 2021-05-02 NOTE — Telephone Encounter (Signed)
Pt was already referred to ortho so she was already told that they needed to take over pain med

## 2021-05-06 ENCOUNTER — Other Ambulatory Visit: Payer: Self-pay | Admitting: Medical

## 2021-05-06 ENCOUNTER — Telehealth: Payer: Self-pay

## 2021-05-06 MED ORDER — BACLOFEN 10 MG PO TABS: 10.0000 mg | ORAL_TABLET | Freq: Two times a day (BID) | ORAL | 1 refills | Status: DC | PRN

## 2021-05-06 NOTE — Telephone Encounter (Signed)
Pt. Called wanting to know if she could have a refill on baclofen. Last apt was 02/21/21.

## 2021-05-14 ENCOUNTER — Other Ambulatory Visit: Payer: Self-pay | Admitting: Medical

## 2021-05-15 ENCOUNTER — Other Ambulatory Visit: Payer: Self-pay | Admitting: Medical

## 2021-05-16 NOTE — Telephone Encounter (Signed)
Called patient to see if she needed this refill, no answer and no voicemail. Will try again.

## 2021-05-23 ENCOUNTER — Other Ambulatory Visit: Payer: Self-pay | Admitting: Medical

## 2021-05-23 ENCOUNTER — Telehealth: Payer: Self-pay

## 2021-05-23 MED ORDER — NAPROXEN 500 MG PO TABS: 500.0000 mg | ORAL_TABLET | Freq: Two times a day (BID) | ORAL | 0 refills | Status: DC

## 2021-05-23 NOTE — Telephone Encounter (Signed)
Pt req refill Naprosyn

## 2021-05-27 NOTE — Telephone Encounter (Signed)
done

## 2021-05-29 ENCOUNTER — Other Ambulatory Visit: Payer: Self-pay | Admitting: Medical

## 2021-06-01 ENCOUNTER — Other Ambulatory Visit: Payer: Self-pay | Admitting: Medical

## 2021-06-01 ENCOUNTER — Telehealth: Payer: Self-pay | Admitting: Medical

## 2021-06-01 MED ORDER — BACLOFEN 10 MG PO TABS: 10.0000 mg | ORAL_TABLET | Freq: Two times a day (BID) | ORAL | 0 refills | Status: DC

## 2021-06-01 MED ORDER — BACLOFEN 10 MG PO TABS: 10.0000 mg | ORAL_TABLET | Freq: Two times a day (BID) | ORAL | 0 refills | Status: DC | PRN

## 2021-06-01 NOTE — Telephone Encounter (Signed)
Spoke with Whitney Baker and he agreed to refill this medication one more time  #60 and she needed to be seen for cpe with Sula Soda. Scheduled her with Sula Soda. She states that she doesn't want to go back to ortho as they are wanting to do injections and she can't afford it and then said ortho won't refill her bacoflen since primary care sent in medication originally. ? ?Sula Soda- just FYI that patient will continue to ask for refills on bacoflen. She has enough med to get to her appointment with you on 4/14. She says she is in a lot of pain so its up to you if you want to take over the baclofen refills.  ?

## 2021-06-01 NOTE — Telephone Encounter (Signed)
Pt has requested bacoflen multiple times and she has been informed that she must see ortho for this. She has saw [facility] and was also sent to [facility] as well. Please advise  ?

## 2021-06-01 NOTE — Telephone Encounter (Signed)
Pt called and pt informed of denial for baclofen. Pt states that we do fill that for her not ortho. Pt leaving a message for sabrina as well.  ?

## 2021-06-02 NOTE — Telephone Encounter (Signed)
Got it!     Thanks =).

## 2021-06-09 ENCOUNTER — Other Ambulatory Visit: Payer: Self-pay | Admitting: Medical

## 2021-06-09 NOTE — Telephone Encounter (Signed)
This was already done last week by shane for 30 days. denying ?

## 2021-06-20 ENCOUNTER — Other Ambulatory Visit: Payer: Self-pay | Admitting: Medical

## 2021-06-28 NOTE — Progress Notes (Deleted)
error 

## 2021-06-29 ENCOUNTER — Encounter: Payer: Self-pay | Admitting: Physician Assistant

## 2021-06-30 NOTE — Progress Notes (Signed)
? ?Complete physical exam ? ? ?Patient: Whitney Baker   DOB: October 22, 1975   46 y.o. Female  MRN: [id number] ?Visit Date: 07/01/2021 ? ?Chief Complaint  ?Patient presents with  ? Annual Exam  ?  Fasting CPE   ? ?Subjective  ?  ?Whitney Baker is a 46 y.o. female who presents today for a complete physical exam.  ? ?Reports is generally feeling well; is eating a diabetic diet, is followed by a Dietician; and drinks adequate water a day to manage her diet controlled diabetes; requests a refill of baclofen and naprosyn for her chronic low back pain; states she will follow up with Orthopedics for her next steps with treatment for low back pain. ? ? ?HPI ?HPI   ? ? Annual Exam   ? Additional comments: Fasting CPE  ? ?  ?  ?Last edited by Deforest Hoyles, [place] on 07/01/2021  3:07 PM.  ?  ?  ? ? ?Past Medical History:  ?Diagnosis Date  ? DKA (diabetic ketoacidosis) ([place]) 06/09/2020  ? Hidradenitis   ? Obesity   ? Sciatic leg pain   ? Type 2 diabetes mellitus in remission ([place]) 07/01/2021  ? ?No past surgical history on file. ?Social History  ? ?Socioeconomic History  ? Marital status: Single  ?  Spouse name: Not on file  ? Number of children: Not on file  ? Years of education: Not on file  ? Highest education level: Not on file  ?Occupational History  ? Not on file  ?Tobacco Use  ? Smoking status: Former  ? Smokeless tobacco: Never  ?Substance and Sexual Activity  ? Alcohol use: Yes  ?  Comment: occas  ? Drug use: Never  ? Sexual activity: Not on file  ?Other Topics Concern  ? Not on file  ?Social History Narrative  ? Not on file  ? ?Social Determinants of Health  ? ?Financial Resource Strain: Not on file  ?Food Insecurity: Not on file  ?Transportation Needs: Not on file  ?Physical Activity: Not on file  ?Stress: Not on file  ?Social Connections: Not on file  ?Intimate Partner Violence: Not on file  ? ?Family Status  ?Relation Name Status  ? Mother  Alive  ? MGM  (Not Specified)  ? Neg Hx  (Not Specified)  ? ?Family  History  ?Problem Relation Age of Onset  ? Hypertension Mother   ? Hyperlipidemia Mother   ? Osteoarthritis Mother   ? Diabetes Maternal Grandmother   ? Breast cancer Neg Hx   ? Prostate cancer Neg Hx   ? CVA Neg Hx   ? Heart attack Neg Hx   ? ?Allergies  ?Allergen Reactions  ? Prednisone   ?  Cannot take r/t diabes  ?  ?Patient Care Team: ?Marcellina Millin as PCP - General (Physician Assistant)  ? ?Medications: ?Outpatient Medications Prior to Visit  ?Medication Sig  ? blood glucose meter kit and supplies Dispense based on patient and insurance preference. Use up to four times daily as directed. (FOR ICD-10 E10.9, E11.9).  ? Blood Glucose Monitoring Suppl (ONETOUCH VERIO) w/Device KIT 1 Device by Does not apply route daily in the afternoon.  ? glucose blood (ONETOUCH VERIO) test strip 1 each by Other route daily in the afternoon. Use as instructed  ? OneTouch Delica Lancets 75Z MISC 1 Device by Other route daily in the afternoon. Test twice a day  ? ONETOUCH VERIO test strip USE TO TEST TWICE A DAY  ? polyethylene  glycol powder (GLYCOLAX/MIRALAX) 17 GM/SCOOP powder Take 17 g by mouth daily as needed.  ? [DISCONTINUED] baclofen (LIORESAL) 10 MG tablet Take 1 tablet (10 mg total) by mouth 2 (two) times daily.  ? [DISCONTINUED] gabapentin (NEURONTIN) 100 MG capsule Take 2 capsules (200 mg total) by mouth at bedtime.  ? [DISCONTINUED] naproxen (NAPROSYN) 500 MG tablet Take 1 tablet (500 mg total) by mouth 2 (two) times daily with a meal.  ? [DISCONTINUED] naproxen (NAPROSYN) 500 MG tablet Take 500 mg by mouth 2 (two) times daily with a meal.  ? gabapentin (NEURONTIN) 300 MG capsule Take 300 mg by mouth 3 (three) times daily.  ? [DISCONTINUED] naproxen (NAPROSYN) 500 MG tablet Take 1 tablet (500 mg total) by mouth 2 (two) times daily with a meal.  ? ?No facility-administered medications prior to visit.  ? ? ?Review of Systems  ?Constitutional:  Negative for activity change and fever.  ?HENT:  Negative for  congestion, ear pain and voice change.   ?Eyes:  Negative for redness.  ?Respiratory:  Negative for cough.   ?Cardiovascular:  Negative for chest pain.  ?Gastrointestinal:  Negative for constipation and diarrhea.  ?Endocrine: Negative for polyuria.  ?Genitourinary:  Negative for flank pain.  ?Musculoskeletal:  Positive for arthralgias, back pain and myalgias. Negative for gait problem and neck stiffness.  ?Skin:  Negative for color change and rash.  ?Neurological:  Negative for dizziness.  ?Hematological:  Negative for adenopathy.  ?Psychiatric/Behavioral:  Negative for agitation, behavioral problems and confusion.   ? ?Last CBC ?Lab Results  ?Component Value Date  ? WBC 8.2 07/01/2021  ? HGB 13.1 07/01/2021  ? HCT 39.9 07/01/2021  ? MCV 85.6 07/01/2021  ? MCH 27.4 06/15/2020  ? RDW 13.1 07/01/2021  ? PLT 331.0 07/01/2021  ? ?Last metabolic panel ?Lab Results  ?Component Value Date  ? GLUCOSE 117 (H) 07/01/2021  ? NA 136 07/01/2021  ? K 4.5 07/01/2021  ? CL 103 07/01/2021  ? CO2 26 07/01/2021  ? BUN 24 (H) 07/01/2021  ? CREATININE 0.87 07/01/2021  ? EGFR 103 06/15/2020  ? CALCIUM 9.7 07/01/2021  ? PROT 7.0 07/01/2021  ? ALBUMIN 4.6 07/01/2021  ? LABGLOB 3.0 06/15/2020  ? AGRATIO 1.1 (L) 06/15/2020  ? BILITOT 0.6 07/01/2021  ? ALKPHOS 79 07/01/2021  ? AST 16 07/01/2021  ? ALT 19 07/01/2021  ? ANIONGAP 8 06/12/2020  ? ?Last lipids ?Lab Results  ?Component Value Date  ? CHOL 264 (H) 07/01/2021  ? HDL 40.20 07/01/2021  ? LDLCALC 204 (H) 07/01/2021  ? TRIG 96.0 07/01/2021  ? CHOLHDL 7 07/01/2021  ? ?Last hemoglobin A1c ?Lab Results  ?Component Value Date  ? HGBA1C 5.7 (A) 07/01/2021  ? ?Last thyroid functions ?Lab Results  ?Component Value Date  ? TSH 1.32 07/01/2021  ? ?  ? ?The 10-year ASCVD risk score (Arnett DK, et al., 2019) is: 4.5% ? ? Objective  ?  ?BP 138/88   Pulse 62   Ht $R'5\' 8"'Sk$  (1.727 m)   Wt 234 lb 3.2 oz (106.2 kg)   BMI 35.61 kg/m?  ? ?  ? ? ?Physical Exam ?Vitals and nursing note reviewed.   ?Constitutional:   ?   General: She is not in acute distress. ?   Appearance: Normal appearance. She is not ill-appearing.  ?HENT:  ?   Head: Normocephalic and atraumatic.  ?   Right Ear: Tympanic membrane, ear canal and external ear normal.  ?   Left Ear: Tympanic membrane, ear canal  and external ear normal.  ?   Nose: No congestion.  ?Eyes:  ?   Extraocular Movements: Extraocular movements intact.  ?   Conjunctiva/sclera: Conjunctivae normal.  ?   Pupils: Pupils are equal, round, and reactive to light.  ?Neck:  ?   Vascular: No carotid bruit.  ?Cardiovascular:  ?   Rate and Rhythm: Normal rate and regular rhythm.  ?   Pulses: Normal pulses.  ?   Heart sounds: Normal heart sounds.  ?Pulmonary:  ?   Effort: Pulmonary effort is normal.  ?   Breath sounds: Normal breath sounds. No wheezing.  ?Abdominal:  ?   General: Bowel sounds are normal.  ?   Palpations: Abdomen is soft.  ?Musculoskeletal:     ?   General: Normal range of motion.  ?   Cervical back: Normal range of motion and neck supple.  ?   Right lower leg: No edema.  ?   Left lower leg: No edema.  ?Skin: ?   General: Skin is warm and dry.  ?   Findings: No bruising.  ?Neurological:  ?   General: No focal deficit present.  ?   Mental Status: She is alert and oriented to person, place, and time.  ?Psychiatric:     ?   Mood and Affect: Mood normal.     ?   Behavior: Behavior normal.     ?   Thought Content: Thought content normal.  ?  ? ? ?Last depression screening scores ? ?  07/01/2021  ?  3:11 PM 06/15/2020  ?  1:41 PM  ?PHQ 2/9 Scores  ?PHQ - 2 Score 0 1  ? ?Last fall risk screening ? ?  07/01/2021  ?  3:11 PM  ?Fall Risk   ?Falls in the past year? 0  ?Number falls in past yr: 0  ?Injury with Fall? 0  ?Risk for fall due to : No Fall Risks  ?Follow up Falls evaluation completed  ? ? ? ?Results for orders placed or performed in visit on 07/01/21  ?TSH  ?Result Value Ref Range  ? TSH 1.32 0.35 - 5.50 uIU/mL  ?Microalbumin / creatinine urine ratio  ?Result Value  Ref Range  ? Microalb, Ur 0.9 0.0 - 1.9 mg/dL  ? Creatinine,U 188.1 mg/dL  ? Microalb Creat Ratio 0.5 0.0 - 30.0 mg/g  ?Lipid panel  ?Result Value Ref Range  ? Cholesterol 264 (H) 0 - 200 mg/dL  ? Triglycerides 96.0 0.0 - 149.0

## 2021-07-01 ENCOUNTER — Ambulatory Visit (INDEPENDENT_AMBULATORY_CARE_PROVIDER_SITE_OTHER): Payer: BC Managed Care – PPO | Admitting: Internal Medicine

## 2021-07-01 ENCOUNTER — Encounter: Payer: Self-pay | Admitting: Internal Medicine

## 2021-07-01 ENCOUNTER — Encounter: Payer: Self-pay | Admitting: Physician Assistant

## 2021-07-01 ENCOUNTER — Ambulatory Visit (INDEPENDENT_AMBULATORY_CARE_PROVIDER_SITE_OTHER): Payer: BC Managed Care – PPO | Admitting: Physician Assistant

## 2021-07-01 VITALS — BP 120/80 | Ht 68.0 in | Wt 231.8 lb

## 2021-07-01 VITALS — BP 138/88 | HR 62 | Ht 68.0 in | Wt 234.2 lb

## 2021-07-01 DIAGNOSIS — M5442 Lumbago with sciatica, left side: Secondary | ICD-10-CM

## 2021-07-01 DIAGNOSIS — E119 Type 2 diabetes mellitus without complications: Secondary | ICD-10-CM | POA: Insufficient documentation

## 2021-07-01 DIAGNOSIS — E785 Hyperlipidemia, unspecified: Secondary | ICD-10-CM

## 2021-07-01 DIAGNOSIS — Z0000 Encounter for general adult medical examination without abnormal findings: Secondary | ICD-10-CM | POA: Diagnosis not present

## 2021-07-01 DIAGNOSIS — G8929 Other chronic pain: Secondary | ICD-10-CM

## 2021-07-01 DIAGNOSIS — Z6835 Body mass index (BMI) 35.0-35.9, adult: Secondary | ICD-10-CM

## 2021-07-01 HISTORY — DX: Type 2 diabetes mellitus without complications: E11.9

## 2021-07-01 LAB — TSH: TSH: 1.32 u[IU]/mL (ref 0.35–5.50)

## 2021-07-01 LAB — CBC
HCT: 39.9 % (ref 36.0–46.0)
Hemoglobin: 13.1 g/dL (ref 12.0–15.0)
MCHC: 32.8 g/dL (ref 30.0–36.0)
MCV: 85.6 fl (ref 78.0–100.0)
Platelets: 331 10*3/uL (ref 150.0–400.0)
RBC: 4.66 Mil/uL (ref 3.87–5.11)
RDW: 13.1 % (ref 11.5–15.5)
WBC: 8.2 10*3/uL (ref 4.0–10.5)

## 2021-07-01 LAB — COMPREHENSIVE METABOLIC PANEL
ALT: 19 U/L (ref 0–35)
AST: 16 U/L (ref 0–37)
Albumin: 4.6 g/dL (ref 3.5–5.2)
Alkaline Phosphatase: 79 U/L (ref 39–117)
BUN: 24 mg/dL — ABNORMAL HIGH (ref 6–23)
CO2: 26 mEq/L (ref 19–32)
Calcium: 9.7 mg/dL (ref 8.4–10.5)
Chloride: 103 mEq/L (ref 96–112)
Creatinine, Ser: 0.87 mg/dL (ref 0.40–1.20)
GFR: 81.2 mL/min (ref >60.00)
Glucose, Bld: 117 mg/dL — ABNORMAL HIGH (ref 70–99)
Potassium: 4.5 mEq/L (ref 3.5–5.1)
Sodium: 136 mEq/L (ref 135–145)
Total Bilirubin: 0.6 mg/dL (ref 0.2–1.2)
Total Protein: 7 g/dL (ref 6.0–8.3)

## 2021-07-01 LAB — POCT GLYCOSYLATED HEMOGLOBIN (HGB A1C): Hemoglobin A1C: 5.7 % — AB (ref 4.0–5.6)

## 2021-07-01 LAB — MICROALBUMIN / CREATININE URINE RATIO
Creatinine,U: 188.1 mg/dL
Microalb Creat Ratio: 0.5 mg/g (ref 0.0–30.0)
Microalb, Ur: 0.9 mg/dL (ref 0.0–1.9)

## 2021-07-01 LAB — LIPID PANEL
Cholesterol: 264 mg/dL — ABNORMAL HIGH (ref 0–200)
HDL: 40.2 mg/dL (ref >39.00)
LDL Cholesterol: 204 mg/dL — ABNORMAL HIGH (ref 0–99)
NonHDL: 223.51
Total CHOL/HDL Ratio: 7
Triglycerides: 96 mg/dL (ref 0.0–149.0)
VLDL: 19.2 mg/dL (ref 0.0–40.0)

## 2021-07-01 MED ORDER — ONETOUCH VERIO VI STRP: 1.0000 | ORAL_STRIP | Freq: Every day | 3 refills | Status: AC

## 2021-07-01 MED ORDER — ONETOUCH VERIO W/DEVICE KIT: 1.0000 | PACK | Freq: Every day | 0 refills | Status: AC

## 2021-07-01 MED ORDER — BACLOFEN 10 MG PO TABS: 10.0000 mg | ORAL_TABLET | Freq: Two times a day (BID) | ORAL | 1 refills | Status: DC

## 2021-07-01 MED ORDER — NAPROXEN 500 MG PO TABS: 500.0000 mg | ORAL_TABLET | Freq: Two times a day (BID) | ORAL | 3 refills | Status: DC

## 2021-07-01 NOTE — Patient Instructions (Signed)
Keep Up the Good Work ! 

## 2021-07-01 NOTE — Patient Instructions (Signed)

## 2021-07-01 NOTE — Progress Notes (Signed)
?Name: Whitney Baker  ?Age/ Sex: 46 y.o., female   ?MRN/ DOB: [id number], 09-25-1975    ? ?PCP: Irene Pap, PA-C   ?Reason for Endocrinology Evaluation: Type 2 Diabetes Mellitus  ?Initial Endocrine Consultative Visit: 06/21/2020  ? ? ?PATIENT IDENTIFIER: Whitney Baker is a 46 y.o. female with a past medical history of T2DM. The patient has followed with Endocrinology clinic since 06/21/2020 for consultative assistance with management of her diabetes. ? ?DIABETIC HISTORY:  ?Whitney Baker was diagnosed with DM 05/2020. She presented to the Ed with a serum glucose of 1,434 mg/dL with an AG of 32 and  Bicarb level of 11. She presented with back pains, was on prednisone for approximately 4 days prior to her presentation to the ED  and flexeril., Metformin caused diarrhea . Her hemoglobin A1c has ranged from 5.8% in 09/2020, peaking at >15.5% in 05/2020. ? ?She stopped all glycemic agents by 12/2020 ? ?SUBJECTIVE:  ? ?During the last visit (12/31/2020): A1c 6.2% we adjusted MDI regimen per her request  ? ? ?Today (07/01/2021): Whitney Baker is here for follow-up on diabetes management .She checks her blood sugars occasionally .  Her glucose meter has stopped working has not checked glucose since early March.  The patient has not had hypoglycemic episodes since the last clinic visit. ? ? ?Pt continues to lose weight  ?She continues to  be off glycemic agents  ?Denies nausea, vomiting or diarrhea  ?Has occasional constipation- uses miralax  ? ? ? ?HOME DIABETES REGIMEN:  ?N/A  ? ? ?Statin: no  ?ACE-I/ARB: No ?Prior Diabetic Education: Yes ? ? ?METER DOWNLOAD SUMMARY: Did not Bring  ? ? ? ?DIABETIC COMPLICATIONS: ?Microvascular complications:  ?Cataract  ?Denies: CKD, neuropathy  ?Last eye exam: Completed 08/2020 ?  ?Macrovascular complications: ?  ?Denies: CAD, PVD, CVA ? ? ?HISTORY:  ?Past Medical History:  ?Past Medical History:  ?Diagnosis Date  ? Hidradenitis   ? Obesity   ? Sciatic leg pain    ? ?Past Surgical History: No past surgical history on file. ?Social History:  reports that she has quit smoking. She has never used smokeless tobacco. She reports current alcohol use. She reports that she does not use drugs. ?Family History:  ?Family History  ?Problem Relation Age of Onset  ? Hypertension Mother   ? Hyperlipidemia Mother   ? Osteoarthritis Mother   ? Diabetes Maternal Grandmother   ? ? ? ?HOME MEDICATIONS: ?Allergies as of 07/01/2021   ? ?   Reactions  ? Prednisone   ? Cannot take r/t diabes  ? ?  ? ?  ?Medication List  ?  ? ?  ? Accurate as of July 01, 2021 11:00 AM. If you have any questions, ask your nurse or doctor.  ?  ?  ? ?  ? ?baclofen 10 MG tablet ?Commonly known as: LIORESAL ?Take 1 tablet (10 mg total) by mouth 2 (two) times daily. ?  ?blood glucose meter kit and supplies ?Dispense based on patient and insurance preference. Use up to four times daily as directed. (FOR ICD-10 E10.9, E11.9). ?  ?gabapentin 100 MG capsule ?Commonly known as: NEURONTIN ?Take 2 capsules (200 mg total) by mouth at bedtime. ?  ?naproxen 500 MG tablet ?Commonly known as: Naprosyn ?Take 1 tablet (500 mg total) by mouth 2 (two) times daily with a meal. ?  ?naproxen 500 MG tablet ?Commonly known as: Naprosyn ?Take 1 tablet (500 mg total) by mouth 2 (two) times daily with a  meal. ?  ?OneTouch Delica Lancets 00X Misc ?1 Device by Other route daily in the afternoon. Test twice a day ?  ?OneTouch Verio test strip ?Generic drug: glucose blood ?USE TO TEST TWICE A DAY ?What changed: Another medication with the same name was added. Make sure you understand how and when to take each. ?Changed by: Dorita Sciara, MD ?  ?OneTouch Verio test strip ?Generic drug: glucose blood ?1 each by Other route daily in the afternoon. Use as instructed ?What changed: You were already taking a medication with the same name, and this prescription was added. Make sure you understand how and when to take each. ?Changed by: Dorita Sciara, MD ?  ?OneTouch Verio w/Device Kit ?1 Device by Does not apply route daily in the afternoon. ?Started by: Dorita Sciara, MD ?  ?polyethylene glycol powder 17 GM/SCOOP powder ?Commonly known as: GLYCOLAX/MIRALAX ?Take 17 g by mouth daily as needed. ?  ? ?  ? ? ? ?OBJECTIVE:  ? ?Vital Signs: BP 120/80 (BP Location: Right Arm, Patient Position: Sitting, Cuff Size: Normal)   Ht _0  (1.727 m)   Wt 231 lb 12.8 oz (105.1 kg)   BMI 35.25 kg/m?   ?Wt Readings from Last 3 Encounters:  ?07/01/21 231 lb 12.8 oz (105.1 kg)  ?02/21/21 232 lb (105.2 kg)  ?01/26/21 236 lb 3.2 oz (107.1 kg)  ? ? ? ?Exam: ?General: Pt appears well and is in NAD  ?Lungs: Clear with good BS bilat with no rales, rhonchi, or wheezes  ?Heart: RRR with normal S1 and S2 and no gallops; no murmurs; no rub  ?Abdomen: Normoactive bowel sounds, soft, nontender  ?Extremities: No pretibial edema.   ?Neuro: MS is good with appropriate affect, pt is alert and Ox3  ? ? ?  DM foot exam: 06/21/2020 ?  ?The skin of the feet is intact without sores or ulcerations. ?The pedal pulses are 2+ on right and 2+ on left. ?The sensation is intact to a screening 5.07, 10 gram monofilament bilaterally ?  ? ? ? ?DATA REVIEWED: ? ?Lab Results  ?Component Value Date  ? HGBA1C 5.7 (A) 07/01/2021  ? HGBA1C 6.2 (A) 12/31/2020  ? HGBA1C 5.6 09/24/2020  ? ? Latest Reference Range & Units 07/01/21 09:42  ?Sodium 135 - 145 mEq/L 136  ?Potassium 3.5 - 5.1 mEq/L 4.5  ?Chloride 96 - 112 mEq/L 103  ?CO2 19 - 32 mEq/L 26  ?Glucose 70 - 99 mg/dL 117 (H)  ?BUN 6 - 23 mg/dL 24 (H)  ?Creatinine 0.40 - 1.20 mg/dL 0.87  ?Calcium 8.4 - 10.5 mg/dL 9.7  ?Alkaline Phosphatase 39 - 117 U/L 79  ?Albumin 3.5 - 5.2 g/dL 4.6  ?AST 0 - 37 U/L 16  ?ALT 0 - 35 U/L 19  ?Total Protein 6.0 - 8.3 g/dL 7.0  ?Total Bilirubin 0.2 - 1.2 mg/dL 0.6  ?GFR >60.00 mL/min 81.20  ?Total CHOL/HDL Ratio  7  ?Cholesterol 0 - 200 mg/dL 264 (H)  ?HDL Cholesterol >39.00 mg/dL 40.20  ?LDL (calc) 0 - 99 mg/dL 204  (H)  ?NonHDL  223.51  ?Triglycerides 0.0 - 149.0 mg/dL 96.0  ?VLDL 0.0 - 40.0 mg/dL 19.2  ? ? Latest Reference Range & Units 07/01/21 09:42  ?WBC 4.0 - 10.5 K/uL 8.2  ?RBC 3.87 - 5.11 Mil/uL 4.66  ?Hemoglobin 12.0 - 15.0 g/dL 13.1  ?HCT 36.0 - 46.0 % 39.9  ?MCV 78.0 - 100.0 fl 85.6  ?MCHC 30.0 - 36.0 g/dL 32.8  ?  RDW 11.5 - 15.5 % 13.1  ?Platelets 150.0 - 400.0 K/uL 331.0  ?Glucose 70 - 99 mg/dL 117 (H)  ? ? Latest Reference Range & Units 07/01/21 09:42  ?TSH 0.35 - 5.50 uIU/mL 1.32  ? ? ? ? ?Glutamic Acid Decarb Ab <5 IU/mL <5   ? ?ISLET CELL ANTIBODY SCREEN NEGATIVE NEGATIVE   ? ? ?ASSESSMENT / PLAN / RECOMMENDATIONS:  ? ?1) Type 2 Diabetes Mellitus, In remission , Without complications - Most recent A1c of 5.7 %. Goal A1c < 7.0 %.   ? ? ?-I have praised the patient on optimizing glycemic control  ?-She continues to do very well with lifestyle changes ?-BMP normal except for slight elevation in BUN ? ? ?MEDICATIONS: ?N/A ? ?EDUCATION / INSTRUCTIONS: ?BG monitoring instructions: Patient is instructed to check her blood sugars 2-3 times a week ? ? ? ?2) Diabetic complications:  ?Eye: Does not have known diabetic retinopathy.  ?Neuro/ Feet: Does not have known diabetic peripheral neuropathy .  ?Renal: Patient does not have known baseline CKD.  ? ?3) Dyslipidemia:  ?  ?- LDL has trended up to 204 mg/dL, Tg has normalized  . ?-She was prescribed statin therapy in the past but she is skeptical ?-We had discussed cardiovascular benefits of statins in the past ? ? ? ?F/U in 6 months ? ? ?Signed electronically by: ?Abby Nena Jordan, MD ? ?[facility]  ?[facility] ?[address] ?[place], [place] [address] ?Phone: [phone] ?FAX: [phone] ? ? ?CC: ?Marcellina Millin ?[address] ?[place] [address] ?Phone: [phone]  ?Fax: [phone] ? ?Return to Endocrinology clinic as below: ?Future Appointments  ?Date Time Provider [place]  ?07/01/2021  3:00  PM Irene Pap, PA-C [facility]  ?12/30/2021  9:10 AM Brit Wernette, Melanie Crazier, MD [facility] None  ? ? ?  ? ? ?

## 2021-07-04 ENCOUNTER — Encounter: Payer: Self-pay | Admitting: Physician Assistant

## 2021-07-04 DIAGNOSIS — E782 Mixed hyperlipidemia: Secondary | ICD-10-CM | POA: Insufficient documentation

## 2021-07-04 DIAGNOSIS — Z6835 Body mass index (BMI) 35.0-35.9, adult: Secondary | ICD-10-CM | POA: Insufficient documentation

## 2021-07-04 DIAGNOSIS — G8929 Other chronic pain: Secondary | ICD-10-CM | POA: Insufficient documentation

## 2021-07-04 NOTE — Assessment & Plan Note (Signed)
Diet-controlled, eat a low sugar diet, avoid starchy food with a lot of carbohydrates, avoid fried and processed foods; last hgb a1c 6.2 on 12/31/2020 ? ?

## 2021-07-04 NOTE — Assessment & Plan Note (Signed)
Stable, drink 8 - 10 glasses of water daily, limit sugar intake and eat a low fat diet, exercise 3 - 5 days a week, example walking 1 - 2 miles  ? ?

## 2021-07-04 NOTE — Assessment & Plan Note (Signed)
Stable, baclofen and naprosyn refilled, follow up with Orthopedics ?

## 2021-07-13 ENCOUNTER — Other Ambulatory Visit: Payer: Self-pay | Admitting: Medical

## 2021-08-15 ENCOUNTER — Other Ambulatory Visit: Payer: Self-pay | Admitting: Physician Assistant

## 2021-08-16 NOTE — Telephone Encounter (Signed)
Pharmacy is requesting refill on Baclofen

## 2021-09-12 ENCOUNTER — Other Ambulatory Visit: Payer: Self-pay | Admitting: Physician Assistant

## 2021-09-16 ENCOUNTER — Encounter: Payer: Self-pay | Admitting: Internal Medicine

## 2021-09-28 ENCOUNTER — Telehealth: Payer: Self-pay | Admitting: Physician Assistant

## 2021-09-28 NOTE — Telephone Encounter (Signed)
Left message for pt to call me back 

## 2021-09-28 NOTE — Telephone Encounter (Signed)
Pt called to request refills on naproxen. This is a former [facility] pt. Pt uses [facility]. Sending back to Annabella.

## 2021-09-28 NOTE — Telephone Encounter (Signed)
Pt states that she uses this medication 3 times a week or when she has a sciatic nerve flare up. Please advise if this can be refilled. She has 2 pills left

## 2021-09-29 ENCOUNTER — Other Ambulatory Visit: Payer: Self-pay | Admitting: Medical

## 2021-09-29 MED ORDER — NAPROXEN 500 MG PO TABS: 500.0000 mg | ORAL_TABLET | Freq: Two times a day (BID) | ORAL | 0 refills | Status: DC

## 2021-09-29 NOTE — Telephone Encounter (Signed)
Pt was notified and was not happy that I told her its not used for long term use.   Pt states she might have to find another practice as she is not happy that 2 of her providers have left already.

## 2021-10-07 ENCOUNTER — Other Ambulatory Visit: Payer: Self-pay | Admitting: Physician Assistant

## 2021-10-11 NOTE — Progress Notes (Deleted)
error 

## 2021-10-11 NOTE — Progress Notes (Unsigned)
This encounter was created in error - please disregard.

## 2021-10-11 NOTE — Progress Notes (Deleted)
Procedures  error

## 2021-10-11 NOTE — Progress Notes (Deleted)
This encounter was created in error - please disregard.

## 2021-11-01 ENCOUNTER — Other Ambulatory Visit: Payer: Self-pay | Admitting: Medical

## 2021-11-01 NOTE — Telephone Encounter (Signed)
Refill request for Naproxen last apt was 07/01/21 and next apt is 07/07/22.

## 2021-11-23 ENCOUNTER — Encounter: Payer: Self-pay | Admitting: Internal Medicine

## 2021-12-27 ENCOUNTER — Encounter: Payer: Self-pay | Admitting: Internal Medicine

## 2021-12-30 ENCOUNTER — Ambulatory Visit: Payer: BC Managed Care – PPO | Admitting: Internal Medicine

## 2021-12-30 NOTE — Progress Notes (Deleted)
Name: Whitney Baker  Age/ Sex: 46 y.o., female   MRN/ DOB: [id number], [birth year]     PCP: Marcellina Millin   Reason for Endocrinology Evaluation: Type 2 Diabetes Mellitus  Initial Endocrine Consultative Visit: 06/21/2020    PATIENT IDENTIFIER: Whitney Baker is a 46 y.o. female with a past medical history of T2DM. The patient has followed with Endocrinology clinic since 06/21/2020 for consultative assistance with management of her diabetes.  DIABETIC HISTORY:  Whitney Baker was diagnosed with DM 05/2020. She presented to the Ed with a serum glucose of 1,434 mg/dL with an AG of 32 and  Bicarb level of 11. She presented with back pains, was on prednisone for approximately 4 days prior to her presentation to the ED  and flexeril., Metformin caused diarrhea . Her hemoglobin A1c has ranged from 5.8% in 09/2020, peaking at >15.5% in 05/2020.  She stopped all glycemic agents by 12/2020    SUBJECTIVE:   During the last visit (07/01/2021): A1c 5.7%   Today (12/30/2021): Whitney Baker is here for follow-up on diabetes management .She checks her blood sugars occasionally .  Her glucose meter has stopped working has not checked glucose since early March.  The patient has not had hypoglycemic episodes since the last clinic visit.   Pt continues to lose weight  She continues to  be off glycemic agents  Denies nausea, vomiting or diarrhea  Has occasional constipation- uses miralax     HOME DIABETES REGIMEN:  N/A    Statin: no  ACE-I/ARB: No Prior Diabetic Education: Yes   METER DOWNLOAD SUMMARY: Did not Bring     DIABETIC COMPLICATIONS: Microvascular complications:  Cataract  Denies: CKD, neuropathy  Last eye exam: Completed 08/2020   Macrovascular complications:   Denies: CAD, PVD, CVA   HISTORY:  Past Medical History:  Past Medical History:  Diagnosis Date   DKA (diabetic ketoacidosis) ([place]) 06/09/2020   Hidradenitis    Obesity    Sciatic leg  pain    Type 2 diabetes mellitus in remission ([place]) 07/01/2021   Type 2 diabetes mellitus without complication, without long-term current use of insulin ([place]) 09/24/2020   Past Surgical History: No past surgical history on file. Social History:  reports that she has quit smoking. She has never used smokeless tobacco. She reports current alcohol use. She reports that she does not use drugs. Family History:  Family History  Problem Relation Age of Onset   Hypertension Mother    Hyperlipidemia Mother    Osteoarthritis Mother    Diabetes Maternal Grandmother    Breast cancer Neg Hx    Prostate cancer Neg Hx    CVA Neg Hx    Heart attack Neg Hx      HOME MEDICATIONS: Allergies as of 12/30/2021       Reactions   Prednisone    Cannot take r/t diabes        Medication List        Accurate as of December 30, 2021  7:23 AM. If you have any questions, ask your nurse or doctor.          baclofen 10 MG tablet Commonly known as: LIORESAL TAKE 1 TABLET BY MOUTH TWICE A DAY   blood glucose meter kit and supplies Dispense based on patient and insurance preference. Use up to four times daily as directed. (FOR ICD-10 E10.9, E11.9).   gabapentin 300 MG capsule Commonly known as: NEURONTIN Take 300 mg by mouth 3 (three) times  daily.   naproxen 500 MG tablet Commonly known as: NAPROSYN TAKE 1 TABLET (500 MG TOTAL) BY MOUTH 2 (TWO) TIMES DAILY WITH A MEAL. TAKE 1 TABLET BY MOUTH EVERY 12 HOURS AS NEEDED WITH FOOD   OneTouch Delica Lancets 50N Misc 1 Device by Other route daily in the afternoon. Test twice a day   OneTouch Verio test strip Generic drug: glucose blood USE TO TEST TWICE A DAY   OneTouch Verio test strip Generic drug: glucose blood 1 each by Other route daily in the afternoon. Use as instructed   OneTouch Verio w/Device Kit 1 Device by Does not apply route daily in the afternoon.   polyethylene glycol powder 17 GM/SCOOP powder Commonly known as:  GLYCOLAX/MIRALAX Take 17 g by mouth daily as needed.         OBJECTIVE:   Vital Signs: There were no vitals taken for this visit.  Wt Readings from Last 3 Encounters:  07/01/21 234 lb 3.2 oz (106.2 kg)  07/01/21 231 lb 12.8 oz (105.1 kg)  02/21/21 232 lb (105.2 kg)     Exam: General: Pt appears well and is in NAD  Lungs: Clear with good BS bilat with no rales, rhonchi, or wheezes  Heart: RRR with normal S1 and S2 and no gallops; no murmurs; no rub  Abdomen: Normoactive bowel sounds, soft, nontender  Extremities: No pretibial edema.   Neuro: MS is good with appropriate affect, pt is alert and Ox3      DM foot exam: 06/21/2020   The skin of the feet is intact without sores or ulcerations. The pedal pulses are 2+ on right and 2+ on left. The sensation is intact to a screening 5.07, 10 gram monofilament bilaterally      DATA REVIEWED:  Lab Results  Component Value Date   HGBA1C 5.7 (A) 07/01/2021   HGBA1C 6.2 (A) 12/31/2020   HGBA1C 5.6 09/24/2020    Latest Reference Range & Units 07/01/21 09:42  Sodium 135 - 145 mEq/L 136  Potassium 3.5 - 5.1 mEq/L 4.5  Chloride 96 - 112 mEq/L 103  CO2 19 - 32 mEq/L 26  Glucose 70 - 99 mg/dL 117 (H)  BUN 6 - 23 mg/dL 24 (H)  Creatinine 0.40 - 1.20 mg/dL 0.87  Calcium 8.4 - 10.5 mg/dL 9.7  Alkaline Phosphatase 39 - 117 U/L 79  Albumin 3.5 - 5.2 g/dL 4.6  AST 0 - 37 U/L 16  ALT 0 - 35 U/L 19  Total Protein 6.0 - 8.3 g/dL 7.0  Total Bilirubin 0.2 - 1.2 mg/dL 0.6  GFR >60.00 mL/min 81.20  Total CHOL/HDL Ratio  7  Cholesterol 0 - 200 mg/dL 264 (H)  HDL Cholesterol >39.00 mg/dL 40.20  LDL (calc) 0 - 99 mg/dL 204 (H)  NonHDL  223.51  Triglycerides 0.0 - 149.0 mg/dL 96.0  VLDL 0.0 - 40.0 mg/dL 19.2    Latest Reference Range & Units 07/01/21 09:42  WBC 4.0 - 10.5 K/uL 8.2  RBC 3.87 - 5.11 Mil/uL 4.66  Hemoglobin 12.0 - 15.0 g/dL 13.1  HCT 36.0 - 46.0 % 39.9  MCV 78.0 - 100.0 fl 85.6  MCHC 30.0 - 36.0 g/dL 32.8  RDW 11.5 -  15.5 % 13.1  Platelets 150.0 - 400.0 K/uL 331.0  Glucose 70 - 99 mg/dL 117 (H)    Latest Reference Range & Units 07/01/21 09:42  TSH 0.35 - 5.50 uIU/mL 1.32      Glutamic Acid Decarb Ab <5 IU/mL <5    ISLET  CELL ANTIBODY SCREEN NEGATIVE NEGATIVE     ASSESSMENT / PLAN / RECOMMENDATIONS:   1) Type 2 Diabetes Mellitus, In remission , Without complications - Most recent A1c of 5.7 %. Goal A1c < 7.0 %.     -I have praised the patient on optimizing glycemic control  -She continues to do very well with lifestyle changes -BMP normal except for slight elevation in BUN   MEDICATIONS: N/A  EDUCATION / INSTRUCTIONS: BG monitoring instructions: Patient is instructed to check her blood sugars 2-3 times a week    2) Diabetic complications:  Eye: Does not have known diabetic retinopathy.  Neuro/ Feet: Does not have known diabetic peripheral neuropathy .  Renal: Patient does not have known baseline CKD.   3) Dyslipidemia:    - LDL has trended up to 204 mg/dL, Tg has normalized  . -She was prescribed statin therapy in the past but she is skeptical -We had discussed cardiovascular benefits of statins in the past    F/U in 6 months   Signed electronically by: Mack Guise, MD  [facility]  [facility] [address] [place], [place] [address] Phone: [phone] FAX: [phone]   CC: Irene Pap, PA-C No address on file Phone: None  Fax: None  Return to Endocrinology clinic as below: Future Appointments  Date Time Provider [place]  12/30/2021  9:10 AM Shyenne Maggard, Melanie Crazier, MD [facility] None

## 2022-01-31 ENCOUNTER — Encounter: Payer: Self-pay | Admitting: Internal Medicine

## 2022-07-07 ENCOUNTER — Encounter: Payer: BC Managed Care – PPO | Admitting: Physician Assistant

## 2022-09-17 IMAGING — MR MR LUMBAR SPINE W/O CM
4 of 5 series · 26 of 48 positions shown · non-contrast
Comparison: Lumbar radiographs 07/09/2020.

CLINICAL DATA: 45-year-old female with low back pain radiating to
the left lower extremity, calf with numbness for 8 months.

EXAM:
MRI LUMBAR SPINE WITHOUT CONTRAST
TECHNIQUE: Multiplanar, multisequence MR imaging of the lumbar spine was
performed. No intravenous contrast was administered.

[Series 3: T2 · sagittal · 4.0mm · 1.09mm/px · 6 of 15 slices shown (1 of 2)]
[im 1/15]
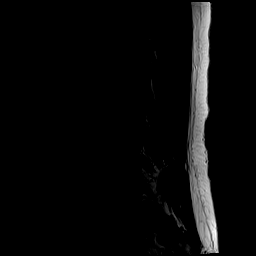
[im 3/15]
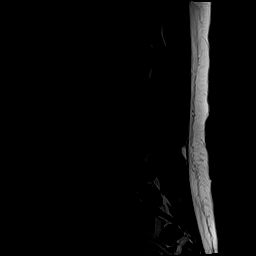
[im 6/15]
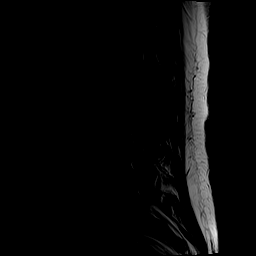
[im 9/15]
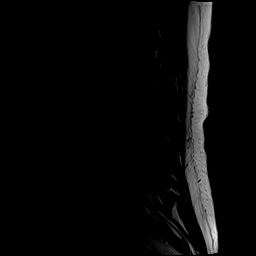
[im 12/15]
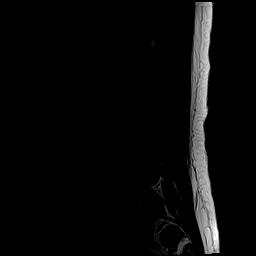
[im 15/15]
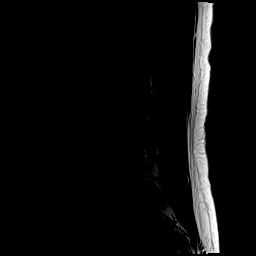

[Series 5: T1 · sagittal · 4.0mm · 1.09mm/px · 5 of 15 slices shown (1 of 2)]
[im 1/15]
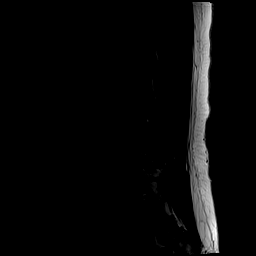
[im 4/15]
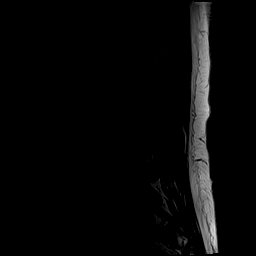
[im 8/15]
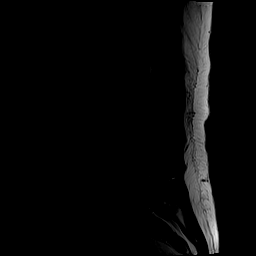
[im 11/15]
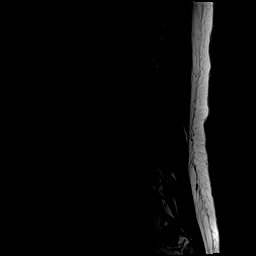
[im 15/15]
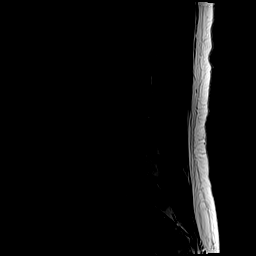

[Series 6: T2 · axial · 4.0mm · 0.39mm/px · z∈[-47,+169]mm · 10 of 43 slices shown (2 of 2)]
[im 3/43]
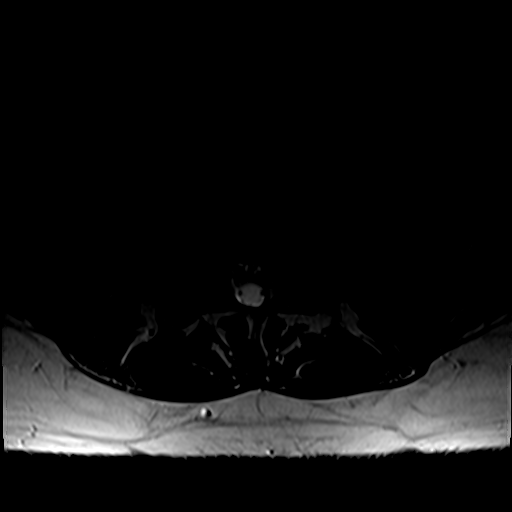
[im 6/43]
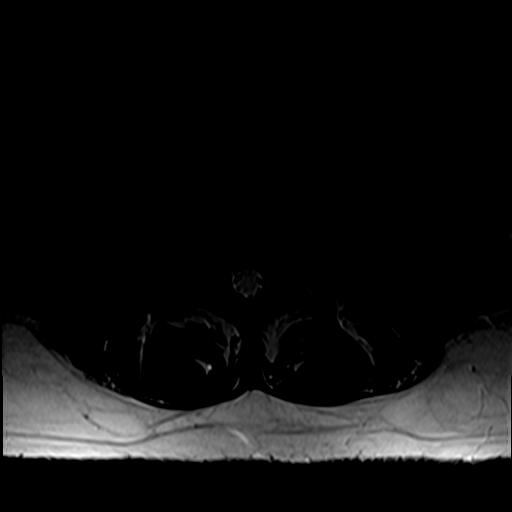
[im 9/43]
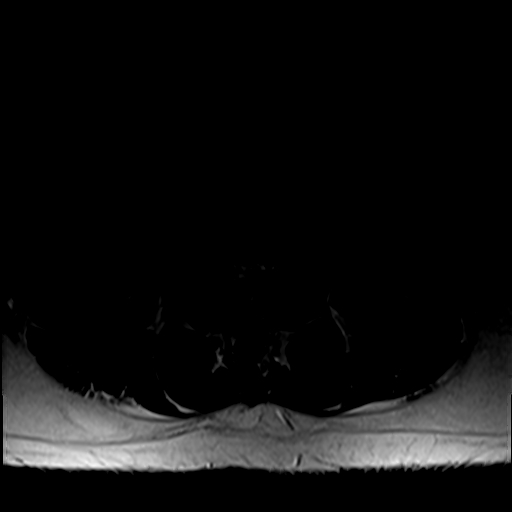
[im 15/43]
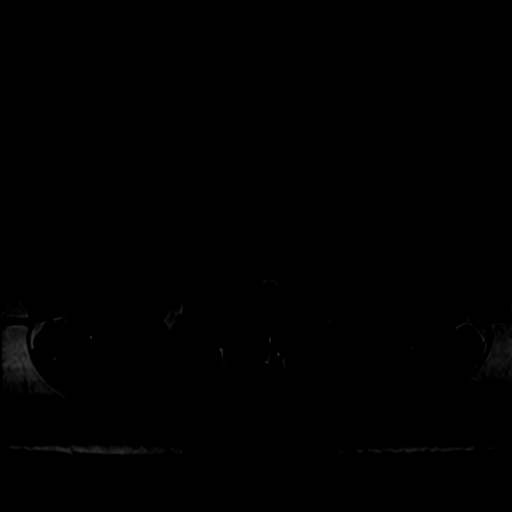
[im 20/43]
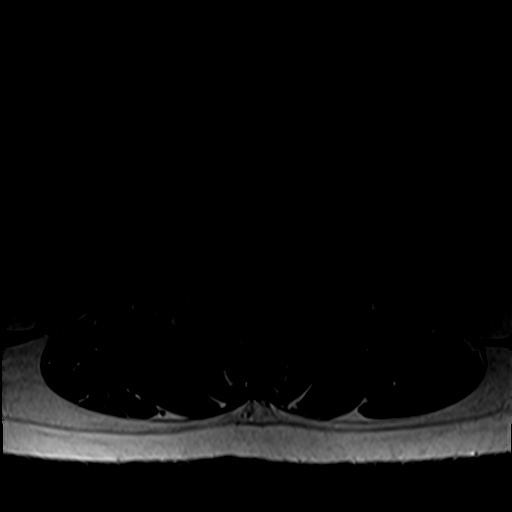
[im 23/43]
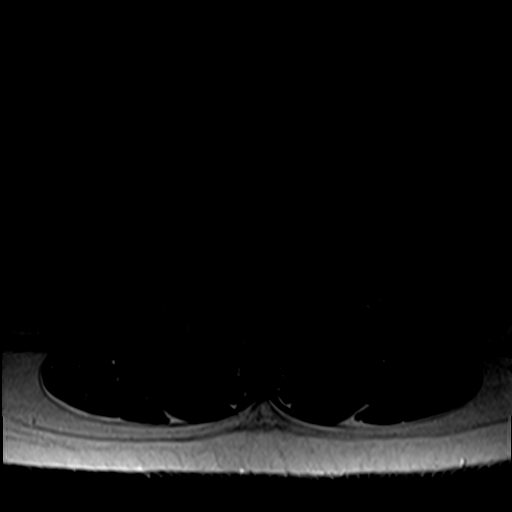
[im 26/43]
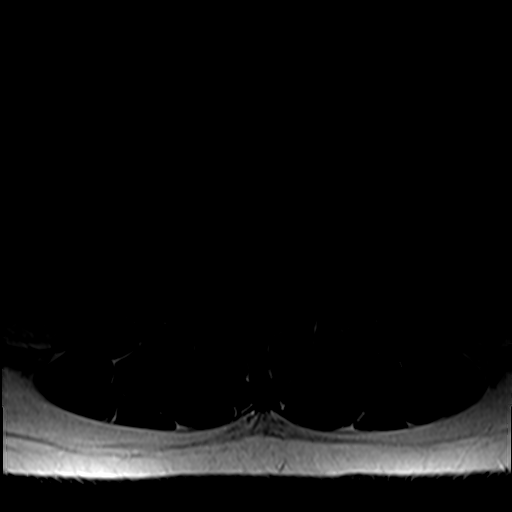
[im 31/43]
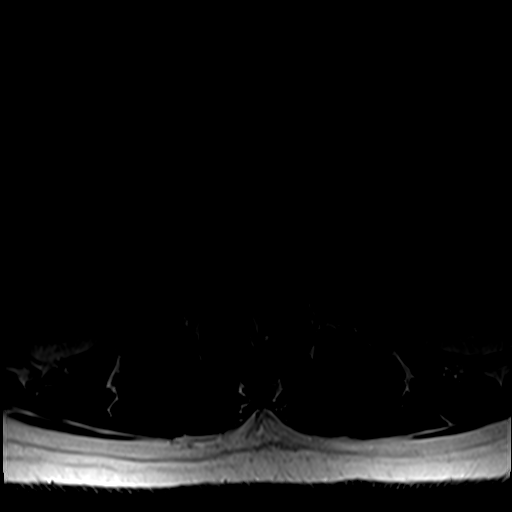
[im 37/43]
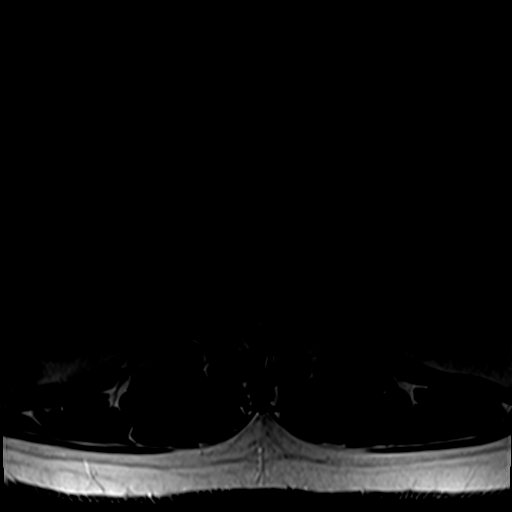
[im 43/43]
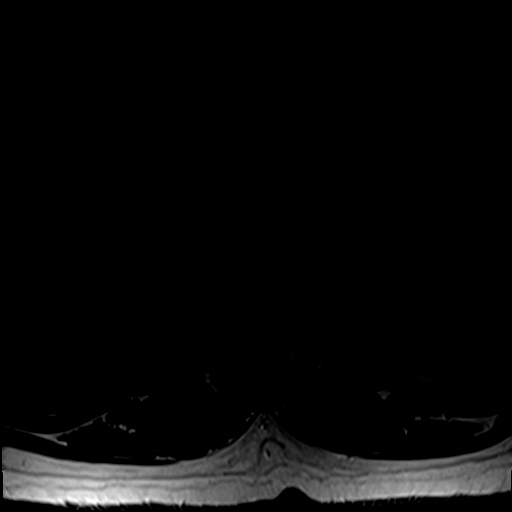

[Series 7: T1 · axial · 4.0mm · 0.39mm/px · z∈[-47,+140]mm · 5 of 43 slices shown (2 of 2)]
[im 3/43]
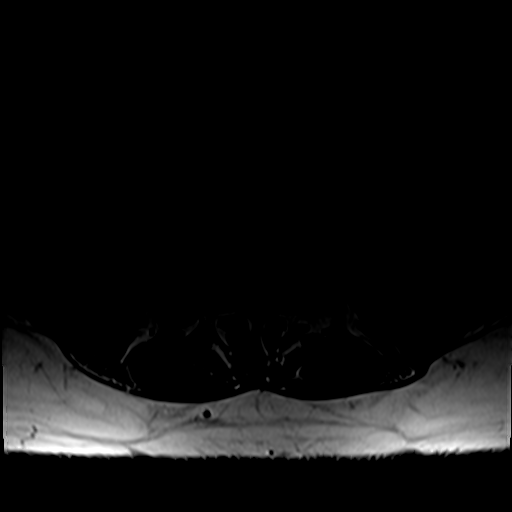
[im 6/43]
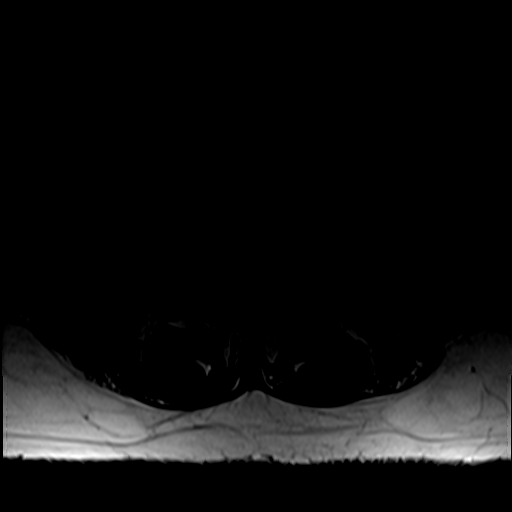
[im 9/43]
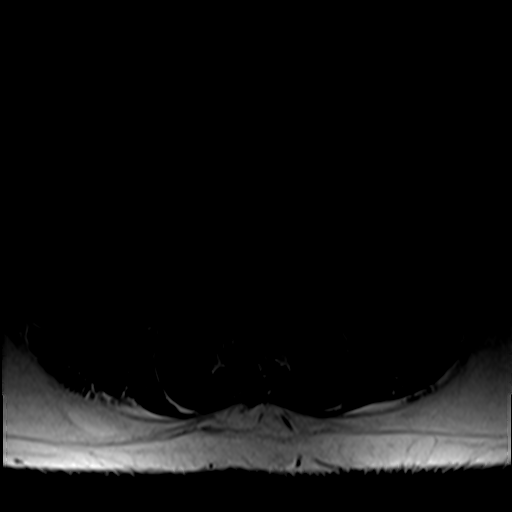
[im 23/43]
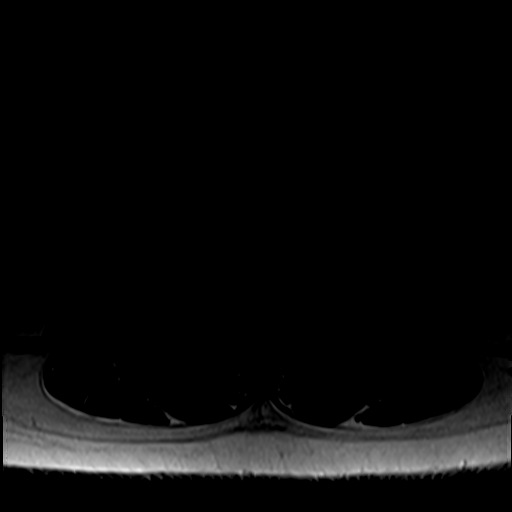
[im 37/43]
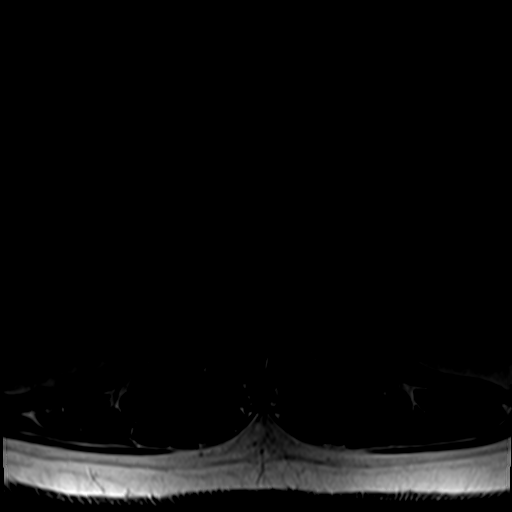

[26 of 48 positions shown; findings below may reference images not displayed]

FINDINGS: Segmentation:  Normal on the comparison.

Alignment: Straightening of lumbar lordosis compared to [place]. No
spondylolisthesis.

Vertebrae: Chronic degenerative endplate marrow signal changes at
L4-L5. normal background bone marrow signal. No marrow edema or
evidence of acute osseous abnormality. Intact visible sacrum and SI
joints.

Conus medullaris and cauda equina: Conus extends to the L1 level. No
lower spinal cord or conus signal abnormality.

Paraspinal and other soft tissues: Negative.

Disc levels:

T11-T12: Mild disc bulging.

T12-L1:  Negative.

L1-L2:  Negative.

L2-L3:  Negative.

L3-L4: Mild disc desiccation and disc space loss. Mild
circumferential disc bulging. Mild to moderate facet hypertrophy
with degenerative facet joint fluid.

Borderline to mild spinal stenosis. No asymmetric lateral recess or
foraminal stenosis.

L4-L5: Severe disc space loss. Circumferential disc osteophyte
complex eccentric to the right. Mild ligament flavum hypertrophy.
Mild left and mild to moderate right lateral recess stenosis (L5
nerve levels). Mild to moderate bilateral L4 foraminal stenosis
greater on the left. Borderline spinal stenosis.

L5-S1: Mild disc bulging eccentric to the left with superimposed
small protrusion into the left lateral recess (series 6, image 39).
Mild to moderate facet hypertrophy greater on the right. Trace facet
joint fluid. No spinal stenosis. But mild to moderate left lateral
recess stenosis (left S1 nerve level). Up to mild bilateral L5
foraminal stenosis.
IMPRESSION: 1. Symptomatic level [DATE]-S1, where a small disc herniation
into the left lateral recess results in mild to moderate stenosis
there. Query left S1 radiculitis.
2. Chronic severe disc and endplate degeneration at L4-L5, but
eccentric to the right. Up to moderate right lateral recess and
bilateral foraminal stenosis.
3. Up to mild spinal stenosis at L3-L4 related to disc bulging and
facet degeneration.
